# Patient Record
Sex: Female | Born: 1980 | Race: Black or African American | Hispanic: No | Marital: Married | Smoking: Never smoker
Health system: Southern US, Community
[De-identification: ages and names within clinical notes are randomized; demographics above are authoritative.]

## PROBLEM LIST (undated history)

## (undated) ENCOUNTER — Inpatient Hospital Stay (HOSPITAL_COMMUNITY): Payer: Self-pay

## (undated) DIAGNOSIS — N760 Acute vaginitis: Secondary | ICD-10-CM

## (undated) DIAGNOSIS — R638 Other symptoms and signs concerning food and fluid intake: Secondary | ICD-10-CM

## (undated) DIAGNOSIS — IMO0002 Reserved for concepts with insufficient information to code with codable children: Secondary | ICD-10-CM

## (undated) DIAGNOSIS — Z3490 Encounter for supervision of normal pregnancy, unspecified, unspecified trimester: Secondary | ICD-10-CM

## (undated) DIAGNOSIS — K8020 Calculus of gallbladder without cholecystitis without obstruction: Secondary | ICD-10-CM

## (undated) DIAGNOSIS — D573 Sickle-cell trait: Secondary | ICD-10-CM

## (undated) DIAGNOSIS — Z832 Family history of diseases of the blood and blood-forming organs and certain disorders involving the immune mechanism: Secondary | ICD-10-CM

## (undated) DIAGNOSIS — Z8619 Personal history of other infectious and parasitic diseases: Secondary | ICD-10-CM

## (undated) DIAGNOSIS — Z91040 Latex allergy status: Secondary | ICD-10-CM

## (undated) DIAGNOSIS — B9689 Other specified bacterial agents as the cause of diseases classified elsewhere: Secondary | ICD-10-CM

## (undated) HISTORY — DX: Calculus of gallbladder without cholecystitis without obstruction: K80.20

## (undated) HISTORY — DX: Reserved for concepts with insufficient information to code with codable children: IMO0002

## (undated) HISTORY — DX: Personal history of other infectious and parasitic diseases: Z86.19

## (undated) HISTORY — DX: Other specified bacterial agents as the cause of diseases classified elsewhere: B96.89

## (undated) HISTORY — DX: Family history of diseases of the blood and blood-forming organs and certain disorders involving the immune mechanism: Z83.2

## (undated) HISTORY — DX: Acute vaginitis: N76.0

## (undated) HISTORY — DX: Latex allergy status: Z91.040

## (undated) HISTORY — DX: Sickle-cell trait: D57.3

## (undated) HISTORY — PX: NO PAST SURGERIES: SHX2092

## (undated) HISTORY — DX: Encounter for supervision of normal pregnancy, unspecified, unspecified trimester: Z34.90

## (undated) HISTORY — DX: Other symptoms and signs concerning food and fluid intake: R63.8

---
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
## 2011-03-15 NOTE — L&D Delivery Note (Addendum)
Delivery Note At 10:37 AM a viable female, "Yesenia George", was delivered via Vaginal, Spontaneous Delivery (Presentation: Right Occiput Anterior).  APGAR: 9, 9; weight .   Placenta status: Intact, Spontaneous.  Cord: 3 vessels with the following complications: None.  Cord pH: NA Compound presentation of vtx and left hand.  Anesthesia: Epidural  Episiotomy: None Lacerations: 2nd degree Suture Repair: 3.0 monocryl Est. Blood Loss (mL): 300 cc  Mom to postpartum.  Baby to skin to skin.. Family does plan circumcision--will decide between inpatient or outpatient. 24 hour urine will be continued via foley catheter and will be completed at 7pm and sent for 24 hour protein assessment.  Yesenia George 11/06/2011, 11:28 AM

## 2011-05-25 ENCOUNTER — Encounter (INDEPENDENT_AMBULATORY_CARE_PROVIDER_SITE_OTHER): Payer: Medicaid Other

## 2011-05-25 DIAGNOSIS — Z331 Pregnant state, incidental: Secondary | ICD-10-CM

## 2011-05-25 LAB — CBC: Platelets: 177 10*3/uL (ref 150–399)

## 2011-05-25 LAB — RUBELLA ANTIBODY, IGM: Rubella: IMMUNE

## 2011-05-25 LAB — ANTIBODY SCREEN: Antibody Screen: NEGATIVE

## 2011-05-25 LAB — HIV ANTIBODY (ROUTINE TESTING W REFLEX): HIV: NONREACTIVE

## 2011-05-25 LAB — GC/CHLAMYDIA PROBE AMP, GENITAL
Chlamydia: NEGATIVE
Gonorrhea: NEGATIVE

## 2011-05-25 LAB — ABO/RH

## 2011-06-02 ENCOUNTER — Encounter (INDEPENDENT_AMBULATORY_CARE_PROVIDER_SITE_OTHER): Payer: Medicaid Other | Admitting: Obstetrics and Gynecology

## 2011-06-02 DIAGNOSIS — Z3480 Encounter for supervision of other normal pregnancy, unspecified trimester: Secondary | ICD-10-CM

## 2011-06-02 DIAGNOSIS — N898 Other specified noninflammatory disorders of vagina: Secondary | ICD-10-CM

## 2011-06-06 ENCOUNTER — Encounter: Payer: Self-pay | Admitting: Obstetrics and Gynecology

## 2011-06-09 ENCOUNTER — Encounter: Payer: Self-pay | Admitting: Obstetrics and Gynecology

## 2011-06-09 ENCOUNTER — Other Ambulatory Visit: Payer: Self-pay

## 2011-06-09 DIAGNOSIS — Z3480 Encounter for supervision of other normal pregnancy, unspecified trimester: Secondary | ICD-10-CM

## 2011-06-13 ENCOUNTER — Other Ambulatory Visit: Payer: Self-pay | Admitting: Obstetrics and Gynecology

## 2011-06-13 DIAGNOSIS — Z1389 Encounter for screening for other disorder: Secondary | ICD-10-CM

## 2011-06-17 DIAGNOSIS — Z91040 Latex allergy status: Secondary | ICD-10-CM | POA: Insufficient documentation

## 2011-06-17 DIAGNOSIS — D573 Sickle-cell trait: Secondary | ICD-10-CM | POA: Insufficient documentation

## 2011-06-17 DIAGNOSIS — R638 Other symptoms and signs concerning food and fluid intake: Secondary | ICD-10-CM | POA: Insufficient documentation

## 2011-06-17 DIAGNOSIS — O0930 Supervision of pregnancy with insufficient antenatal care, unspecified trimester: Secondary | ICD-10-CM | POA: Insufficient documentation

## 2011-06-21 ENCOUNTER — Encounter: Payer: Self-pay | Admitting: Registered Nurse

## 2011-06-21 ENCOUNTER — Ambulatory Visit (INDEPENDENT_AMBULATORY_CARE_PROVIDER_SITE_OTHER): Payer: Medicaid Other

## 2011-06-21 ENCOUNTER — Ambulatory Visit (INDEPENDENT_AMBULATORY_CARE_PROVIDER_SITE_OTHER): Payer: Medicaid Other | Admitting: Registered Nurse

## 2011-06-21 ENCOUNTER — Other Ambulatory Visit: Payer: Medicaid Other

## 2011-06-21 VITALS — BP 110/64 | Wt 272.0 lb

## 2011-06-21 DIAGNOSIS — Z1389 Encounter for screening for other disorder: Secondary | ICD-10-CM

## 2011-06-21 DIAGNOSIS — Z3480 Encounter for supervision of other normal pregnancy, unspecified trimester: Secondary | ICD-10-CM

## 2011-06-21 LAB — US OB COMP + 14 WK

## 2011-06-21 NOTE — Patient Instructions (Signed)
ROB appt.. 4 weeks

## 2011-06-21 NOTE — Progress Notes (Signed)
Denies complaints doing well. U/s rev'd. Advised to call office with questions and/or concerns. Did not receive 1 hour glucola today and as a result prefers to do 4/11 or 4/12. ROB x 4 weeks.

## 2011-06-23 ENCOUNTER — Other Ambulatory Visit (INDEPENDENT_AMBULATORY_CARE_PROVIDER_SITE_OTHER): Payer: Medicaid Other

## 2011-06-23 DIAGNOSIS — Z3480 Encounter for supervision of other normal pregnancy, unspecified trimester: Secondary | ICD-10-CM

## 2011-06-23 DIAGNOSIS — Z331 Pregnant state, incidental: Secondary | ICD-10-CM

## 2011-06-23 LAB — POCT UA - GLUCOSE/PROTEIN: Glucose, UA: NEGATIVE

## 2011-07-19 ENCOUNTER — Encounter: Payer: Self-pay | Admitting: Obstetrics and Gynecology

## 2011-07-19 ENCOUNTER — Ambulatory Visit (INDEPENDENT_AMBULATORY_CARE_PROVIDER_SITE_OTHER): Payer: Medicaid Other | Admitting: Obstetrics and Gynecology

## 2011-07-19 VITALS — BP 116/64 | Wt 277.0 lb

## 2011-07-19 DIAGNOSIS — Z331 Pregnant state, incidental: Secondary | ICD-10-CM

## 2011-07-19 DIAGNOSIS — R300 Dysuria: Secondary | ICD-10-CM

## 2011-07-19 LAB — POCT URINALYSIS DIPSTICK
Glucose, UA: NEGATIVE
Ketones, UA: NEGATIVE
Leukocytes, UA: NEGATIVE
Spec Grav, UA: 1.015
Urobilinogen, UA: NEGATIVE

## 2011-07-19 NOTE — Progress Notes (Signed)
Pt c/o pressure when urinating.  

## 2011-07-19 NOTE — Progress Notes (Signed)
Subjective:    Yesenia George is a 31 y.o. female being seen today for her return obstetrical visit. She is at [redacted]w[redacted]d gestation.  Patient reports backache, occasional contractions and and pelvic pressure.  Fetal movement: normal.   Review of Systems:    Objective:    BP 116/64  Wt 277 lb (125.646 kg)  LMP 02/04/2011  SEE PRENATAL FLOWSHEET  FHT:  140 BPM  Uterine Size: 24 cm  Presentation: unsure    Assessment:    Pregnancy:  G2P0010    Plan:    Patient Active Problem List  Diagnoses  . Late prenatal care  . Increased BMI  . Latex allergy  . Sickle cell trait       TB risk assessment: low OBGCT: discussed. OBGCT: ordered ptl precautions discussed   fetal movement reviewed  Follow up in 3-4 weeks Plans for next visit:  glucola  Alecia Doi A 07/19/2011 1:03 PM

## 2011-07-21 LAB — CULTURE, OB URINE
Colony Count: NO GROWTH
Organism ID, Bacteria: NO GROWTH

## 2011-08-09 ENCOUNTER — Ambulatory Visit (INDEPENDENT_AMBULATORY_CARE_PROVIDER_SITE_OTHER): Payer: Medicaid Other | Admitting: Obstetrics and Gynecology

## 2011-08-09 ENCOUNTER — Encounter: Payer: Self-pay | Admitting: Obstetrics and Gynecology

## 2011-08-09 ENCOUNTER — Other Ambulatory Visit: Payer: Medicaid Other

## 2011-08-09 VITALS — BP 118/62 | Ht 66.0 in | Wt 280.0 lb

## 2011-08-09 DIAGNOSIS — Z331 Pregnant state, incidental: Secondary | ICD-10-CM

## 2011-08-09 DIAGNOSIS — O26849 Uterine size-date discrepancy, unspecified trimester: Secondary | ICD-10-CM

## 2011-08-09 DIAGNOSIS — Z3490 Encounter for supervision of normal pregnancy, unspecified, unspecified trimester: Secondary | ICD-10-CM

## 2011-08-09 LAB — RPR

## 2011-08-09 NOTE — Progress Notes (Signed)
S>D.  [place]\ 1hr glu today

## 2011-08-24 ENCOUNTER — Encounter: Payer: Self-pay | Admitting: Obstetrics and Gynecology

## 2011-08-24 ENCOUNTER — Ambulatory Visit (INDEPENDENT_AMBULATORY_CARE_PROVIDER_SITE_OTHER): Payer: Medicaid Other

## 2011-08-24 ENCOUNTER — Ambulatory Visit (INDEPENDENT_AMBULATORY_CARE_PROVIDER_SITE_OTHER): Payer: Medicaid Other | Admitting: Obstetrics and Gynecology

## 2011-08-24 VITALS — BP 120/74 | Wt 284.0 lb

## 2011-08-24 DIAGNOSIS — O26849 Uterine size-date discrepancy, unspecified trimester: Secondary | ICD-10-CM

## 2011-08-24 DIAGNOSIS — Z331 Pregnant state, incidental: Secondary | ICD-10-CM

## 2011-08-24 DIAGNOSIS — Z3490 Encounter for supervision of normal pregnancy, unspecified, unspecified trimester: Secondary | ICD-10-CM

## 2011-08-24 LAB — US OB FOLLOW UP

## 2011-08-24 NOTE — Progress Notes (Addendum)
No complaints Ultrasound today estimated fetal weight 3 lbs. 1 oz. Present AFI of 14.2 cm cervix 3.6 cm vertex presentation anterior placenta Glucola 86, Hgb 11.1, RPR NR RTO 2 wks

## 2011-09-07 ENCOUNTER — Ambulatory Visit (INDEPENDENT_AMBULATORY_CARE_PROVIDER_SITE_OTHER): Payer: Medicaid Other | Admitting: Obstetrics and Gynecology

## 2011-09-07 VITALS — BP 110/70 | Wt 284.0 lb

## 2011-09-07 DIAGNOSIS — Z331 Pregnant state, incidental: Secondary | ICD-10-CM

## 2011-09-07 DIAGNOSIS — R12 Heartburn: Secondary | ICD-10-CM

## 2011-09-07 LAB — POCT URINALYSIS DIPSTICK
Ketones, UA: NEGATIVE
Leukocytes, UA: NEGATIVE
Protein, UA: NEGATIVE
pH, UA: 7

## 2011-09-07 MED ORDER — PANTOPRAZOLE SODIUM 20 MG PO TBEC: 20.0000 mg | DELAYED_RELEASE_TABLET | Freq: Every day | ORAL | Status: DC

## 2011-09-07 NOTE — Progress Notes (Signed)
History of sickle cell trait.  Father of the baby has not been tested.  He was referred for testing.  UA negative. Complains of increasing heartburn.  Try Protonix 20 mg each day. Return to office in 2 weeks. Dr. Stefano Gaul

## 2011-09-07 NOTE — Progress Notes (Signed)
C/O: Heartburn getting worse.

## 2011-09-21 ENCOUNTER — Ambulatory Visit (INDEPENDENT_AMBULATORY_CARE_PROVIDER_SITE_OTHER): Payer: Medicaid Other | Admitting: Obstetrics and Gynecology

## 2011-09-21 VITALS — BP 110/58 | Wt 287.0 lb

## 2011-09-21 DIAGNOSIS — D573 Sickle-cell trait: Secondary | ICD-10-CM

## 2011-09-21 DIAGNOSIS — Z331 Pregnant state, incidental: Secondary | ICD-10-CM

## 2011-09-21 DIAGNOSIS — IMO0002 Reserved for concepts with insufficient information to code with codable children: Secondary | ICD-10-CM

## 2011-09-21 NOTE — Progress Notes (Signed)
Good FA. Low muscle pain at end of day resolved with resting. S>D ultrasound at [place]

## 2011-09-21 NOTE — Progress Notes (Signed)
Pt. Stated she has concerns of swelling. No other issues today.

## 2011-09-27 ENCOUNTER — Inpatient Hospital Stay (HOSPITAL_COMMUNITY)
Admission: AD | Admit: 2011-09-27 | Discharge: 2011-09-27 | Disposition: A | Discharge status: Home / Self Care | Payer: Medicaid Other | Source: Ambulatory Visit | Attending: Obstetrics and Gynecology | Admitting: Obstetrics and Gynecology

## 2011-09-27 ENCOUNTER — Telehealth: Payer: Self-pay | Admitting: Obstetrics and Gynecology

## 2011-09-27 ENCOUNTER — Inpatient Hospital Stay (HOSPITAL_COMMUNITY): Payer: Medicaid Other

## 2011-09-27 ENCOUNTER — Encounter (HOSPITAL_COMMUNITY): Payer: Self-pay | Admitting: *Deleted

## 2011-09-27 DIAGNOSIS — O99891 Other specified diseases and conditions complicating pregnancy: Secondary | ICD-10-CM | POA: Insufficient documentation

## 2011-09-27 DIAGNOSIS — W19XXXA Unspecified fall, initial encounter: Secondary | ICD-10-CM

## 2011-09-27 DIAGNOSIS — W1809XA Striking against other object with subsequent fall, initial encounter: Secondary | ICD-10-CM | POA: Insufficient documentation

## 2011-09-27 LAB — URINALYSIS, ROUTINE W REFLEX MICROSCOPIC
Bilirubin Urine: NEGATIVE
Ketones, ur: NEGATIVE mg/dL
Specific Gravity, Urine: 1.01 (ref 1.005–1.030)
Urobilinogen, UA: 0.2 mg/dL (ref 0.0–1.0)

## 2011-09-27 LAB — URINE MICROSCOPIC-ADD ON

## 2011-09-27 MED ORDER — ACETAMINOPHEN 500 MG PO TABS: 1000.0000 mg | ORAL_TABLET | Freq: Once | ORAL | Status: DC | PRN

## 2011-09-27 NOTE — MAU Note (Signed)
Pt  States, " I fell onto a concrete walkway right onto my abdomen at 1:30 pm. I just now felt my baby move."

## 2011-09-27 NOTE — Telephone Encounter (Signed)
Spoke with pt rgdg telephone call. Pt states,"just now fell on abdomen after tripping on something outside when taking out the trash". Pt denies vaginal bleeding. C/o upper abd pain. Decreased fetal movement since fall. Pt is A+. Consulted with chs, pt to go to MAU for eval. Pt voices understanding.

## 2011-09-27 NOTE — MAU Note (Signed)
Cheyenne Adas, CNM here to see patient

## 2011-10-05 ENCOUNTER — Ambulatory Visit (INDEPENDENT_AMBULATORY_CARE_PROVIDER_SITE_OTHER): Payer: Medicaid Other

## 2011-10-05 ENCOUNTER — Other Ambulatory Visit: Payer: Self-pay | Admitting: Obstetrics and Gynecology

## 2011-10-05 ENCOUNTER — Ambulatory Visit (INDEPENDENT_AMBULATORY_CARE_PROVIDER_SITE_OTHER): Payer: Medicaid Other | Admitting: Obstetrics and Gynecology

## 2011-10-05 ENCOUNTER — Encounter: Payer: Self-pay | Admitting: Obstetrics and Gynecology

## 2011-10-05 VITALS — BP 106/62 | Wt 293.0 lb

## 2011-10-05 DIAGNOSIS — IMO0002 Reserved for concepts with insufficient information to code with codable children: Secondary | ICD-10-CM

## 2011-10-05 DIAGNOSIS — O3660X0 Maternal care for excessive fetal growth, unspecified trimester, not applicable or unspecified: Secondary | ICD-10-CM

## 2011-10-05 DIAGNOSIS — O26849 Uterine size-date discrepancy, unspecified trimester: Secondary | ICD-10-CM

## 2011-10-05 NOTE — Progress Notes (Signed)
Doing well. Ultrasound for size greater than dates: Single gestation, vertex, normal fluid, normal placenta. Patient was seen in maternity admission as last week after a fall.  Ultrasound at that point showed growth of the 77th percentile. Repeat ultrasound next visit. Beta strep Visit. Dr. Stefano Gaul

## 2011-10-05 NOTE — Progress Notes (Signed)
Pt c/o swelling in hands and feet.

## 2011-10-06 LAB — US OB LIMITED

## 2011-10-18 ENCOUNTER — Ambulatory Visit (INDEPENDENT_AMBULATORY_CARE_PROVIDER_SITE_OTHER): Payer: Medicaid Other

## 2011-10-18 ENCOUNTER — Ambulatory Visit (INDEPENDENT_AMBULATORY_CARE_PROVIDER_SITE_OTHER): Payer: Medicaid Other | Admitting: Obstetrics and Gynecology

## 2011-10-18 ENCOUNTER — Other Ambulatory Visit: Payer: Self-pay | Admitting: Obstetrics and Gynecology

## 2011-10-18 VITALS — BP 102/58 | Wt 301.0 lb

## 2011-10-18 DIAGNOSIS — O26849 Uterine size-date discrepancy, unspecified trimester: Secondary | ICD-10-CM

## 2011-10-18 DIAGNOSIS — Z331 Pregnant state, incidental: Secondary | ICD-10-CM

## 2011-10-18 NOTE — Progress Notes (Signed)
[redacted]w[redacted]d Ultrasound: Vertex, normal fluid, 37 weeks and 2 days (63rd percentile) Beta strep sent Returned office in 1 week Dr. Stefano Gaul

## 2011-10-18 NOTE — Progress Notes (Signed)
Pt stated c/o swelling in feet no other issues today .

## 2011-10-20 LAB — US OB FOLLOW UP

## 2011-10-26 ENCOUNTER — Encounter: Payer: Self-pay | Admitting: Obstetrics and Gynecology

## 2011-10-26 ENCOUNTER — Ambulatory Visit (INDEPENDENT_AMBULATORY_CARE_PROVIDER_SITE_OTHER): Payer: Medicaid Other | Admitting: Obstetrics and Gynecology

## 2011-10-26 VITALS — BP 114/62 | Wt 303.0 lb

## 2011-10-26 DIAGNOSIS — Z3400 Encounter for supervision of normal first pregnancy, unspecified trimester: Secondary | ICD-10-CM

## 2011-10-26 DIAGNOSIS — O0930 Supervision of pregnancy with insufficient antenatal care, unspecified trimester: Secondary | ICD-10-CM

## 2011-10-26 NOTE — Progress Notes (Signed)
Request Cervix Check. GBS positive.  In-hospital procedure explained Checklist review Information concerning IUD given

## 2011-10-26 NOTE — Patient Instructions (Signed)
mirena iud  Levonorgestrel intrauterine device (IUD) What is this medicine? LEVONORGESTREL IUD (LEE voe nor jes trel) is a contraceptive (birth control) device. It is used to prevent pregnancy and to treat heavy bleeding that occurs during your period. It can be used for up to 5 years. This medicine may be used for other purposes; ask your health care provider or pharmacist if you have questions. What should I tell my health care provider before I take this medicine? They need to know if you have any of these conditions: -abnormal Pap smear -cancer of the breast, uterus, or cervix -diabetes -endometritis -genital or pelvic infection now or in the past -have more than one sexual partner or your partner has more than one partner -heart disease -history of an ectopic or tubal pregnancy -immune system problems -IUD in place -liver disease or tumor -problems with blood clots or take blood-thinners -use intravenous drugs -uterus of unusual shape -vaginal bleeding that has not been explained -an unusual or allergic reaction to levonorgestrel, other hormones, silicone, or polyethylene, medicines, foods, dyes, or preservatives -pregnant or trying to get pregnant -breast-feeding How should I use this medicine? This device is placed inside the uterus by a health care professional. Talk to your pediatrician regarding the use of this medicine in children. Special care may be needed. Overdosage: If you think you have taken too much of this medicine contact a poison control center or emergency room at once. NOTE: This medicine is only for you. Do not share this medicine with others. What if I miss a dose? This does not apply. What may interact with this medicine? Do not take this medicine with any of the following medications: -amprenavir -bosentan -fosamprenavir This medicine may also interact with the following medications: -aprepitant -barbiturate medicines for inducing sleep or treating  seizures -bexarotene -griseofulvin -medicines to treat seizures like carbamazepine, ethotoin, felbamate, oxcarbazepine, phenytoin, topiramate -modafinil -pioglitazone -rifabutin -rifampin -rifapentine -some medicines to treat HIV infection like atazanavir, indinavir, lopinavir, nelfinavir, tipranavir, ritonavir -St. John's wort -warfarin This list may not describe all possible interactions. Give your health care provider a list of all the medicines, herbs, non-prescription drugs, or dietary supplements you use. Also tell them if you smoke, drink alcohol, or use illegal drugs. Some items may interact with your medicine. What should I watch for while using this medicine? Visit your doctor or health care professional for regular check ups. See your doctor if you or your partner has sexual contact with others, becomes HIV positive, or gets a sexual transmitted disease. This product does not protect you against HIV infection (AIDS) or other sexually transmitted diseases. You can check the placement of the IUD yourself by reaching up to the top of your vagina with clean fingers to feel the threads. Do not pull on the threads. It is a good habit to check placement after each menstrual period. Call your doctor right away if you feel more of the IUD than just the threads or if you cannot feel the threads at all. The IUD may come out by itself. You may become pregnant if the device comes out. If you notice that the IUD has come out use a backup birth control method like condoms and call your health care provider. Using tampons will not change the position of the IUD and are okay to use during your period. What side effects may I notice from receiving this medicine? Side effects that you should report to your doctor or health care professional as soon as  possible: -allergic reactions like skin rash, itching or hives, swelling of the face, lips, or tongue -fever, flu-like symptoms -genital sores -high  blood pressure -no menstrual period for 6 weeks during use -pain, swelling, warmth in the leg -pelvic pain or tenderness -severe or sudden headache -signs of pregnancy -stomach cramping -sudden shortness of breath -trouble with balance, talking, or walking -unusual vaginal bleeding, discharge -yellowing of the eyes or skin Side effects that usually do not require medical attention (report to your doctor or health care professional if they continue or are bothersome): -acne -breast pain -change in sex drive or performance -changes in weight -cramping, dizziness, or faintness while the device is being inserted -headache -irregular menstrual bleeding within first 3 to 6 months of use -nausea This list may not describe all possible side effects. Call your doctor for medical advice about side effects. You may report side effects to FDA at 1-800-FDA-1088. Where should I keep my medicine? This does not apply. NOTE: This sheet is a summary. It may not cover all possible information. If you have questions about this medicine, talk to your doctor, pharmacist, or health care provider.  2012, Elsevier/Gold Standard. (03/21/2008 6:39:08 PM  )Intrauterine Device Information An intrauterine device (IUD) is inserted into your uterus and prevents pregnancy. There are 2 types of IUDs available:  Copper IUD. This type of IUD is wrapped in copper wire and is placed inside the uterus. Copper makes the uterus and fallopian tubes produce a fluid that kills sperm. The copper IUD can stay in place for 10 years.   Hormone IUD. This type of IUD contains the hormone progestin (synthetic progesterone). The hormone thickens the cervical mucus and prevents sperm from entering the uterus, and it also thins the uterine lining to prevent implantation of a fertilized egg. The hormone can weaken or kill the sperm that get into the uterus. The hormone IUD can stay in place for 5 years.  Your caregiver will make sure you  are a good candidate for a contraceptive IUD. Discuss with your caregiver the possible side effects. ADVANTAGES  It is highly effective, reversible, long-acting, and low maintenance.   There are no estrogen-related side effects.   An IUD can be used when breastfeeding.   It is not associated with weight gain.   It works immediately after insertion.   The copper IUD does not interfere with your female hormones.   The progesterone IUD can make heavy menstrual periods lighter.   The progesterone IUD can be used for 5 years.   The copper IUD can be used for 10 years.  DISADVANTAGES  The progesterone IUD can be associated with irregular bleeding patterns.   The copper IUD can make your menstrual flow heavier and more painful.   You may experience cramping and vaginal bleeding after insertion.  Document Released: 02/02/2004 Document Revised: 02/17/2011 Document Reviewed: 07/03/2010 [facility] Patient Information 2012 [place], [place].

## 2011-11-02 ENCOUNTER — Telehealth: Payer: Self-pay

## 2011-11-02 ENCOUNTER — Ambulatory Visit (INDEPENDENT_AMBULATORY_CARE_PROVIDER_SITE_OTHER): Payer: Medicaid Other

## 2011-11-02 VITALS — BP 110/68 | Wt 306.0 lb

## 2011-11-02 DIAGNOSIS — O09899 Supervision of other high risk pregnancies, unspecified trimester: Secondary | ICD-10-CM

## 2011-11-02 DIAGNOSIS — O99820 Streptococcus B carrier state complicating pregnancy: Secondary | ICD-10-CM

## 2011-11-02 DIAGNOSIS — Z22330 Carrier of Group B streptococcus: Secondary | ICD-10-CM

## 2011-11-02 NOTE — Telephone Encounter (Signed)
Lm for pt to cb regarding rpt urine sample tomorrow per HS

## 2011-11-02 NOTE — Progress Notes (Signed)
[redacted]w[redacted]d; READY! BLE and pedal edema persists; cx unchanged.  GFM.  Rev'd labor s/s & FKC. BP WNL, but 2+ protein and only noted after pt left.  Will have pt RTO tomorrow to recheck.

## 2011-11-02 NOTE — Telephone Encounter (Signed)
Message copied by Janeece Agee on Wed Nov 02, 2011  4:14 PM ------      Message from: Carolanne Grumbling H      Created: Wed Nov 02, 2011  4:11 PM      Regarding: protein in urine       Saw pt today and didn't see until after pt had left that 2+ protein in urine.  Please call pt and schedule a ROB for tomorrow 11/03/11 to recheck; she may need to do a 24hr urine.  Her BP is normal.        Thanks,      [employer]

## 2011-11-02 NOTE — Progress Notes (Signed)
Pt c/o feet swelling very painful

## 2011-11-03 ENCOUNTER — Other Ambulatory Visit: Payer: Medicaid Other

## 2011-11-03 NOTE — Telephone Encounter (Signed)
Spoke with pt regarding HS note. Pt coming today to give urine sample checking for protein.

## 2011-11-05 ENCOUNTER — Encounter (HOSPITAL_COMMUNITY): Payer: Self-pay | Admitting: *Deleted

## 2011-11-05 ENCOUNTER — Telehealth: Payer: Self-pay | Admitting: Obstetrics and Gynecology

## 2011-11-05 ENCOUNTER — Inpatient Hospital Stay (HOSPITAL_COMMUNITY)
Admission: AD | Admit: 2011-11-05 | Discharge: 2011-11-09 | DRG: 775 | Disposition: A | Discharge status: Home / Self Care | Payer: Medicaid Other | Source: Ambulatory Visit | Attending: Obstetrics and Gynecology | Admitting: Obstetrics and Gynecology

## 2011-11-05 DIAGNOSIS — O139 Gestational [pregnancy-induced] hypertension without significant proteinuria, unspecified trimester: Secondary | ICD-10-CM

## 2011-11-05 DIAGNOSIS — O9902 Anemia complicating childbirth: Secondary | ICD-10-CM | POA: Diagnosis present

## 2011-11-05 DIAGNOSIS — Z22330 Carrier of Group B streptococcus: Secondary | ICD-10-CM

## 2011-11-05 DIAGNOSIS — O904 Postpartum acute kidney failure: Secondary | ICD-10-CM | POA: Diagnosis not present

## 2011-11-05 DIAGNOSIS — O9049 Other postpartum acute kidney failure: Secondary | ICD-10-CM | POA: Diagnosis not present

## 2011-11-05 DIAGNOSIS — R809 Proteinuria, unspecified: Secondary | ICD-10-CM | POA: Diagnosis present

## 2011-11-05 DIAGNOSIS — O1490 Unspecified pre-eclampsia, unspecified trimester: Secondary | ICD-10-CM | POA: Diagnosis present

## 2011-11-05 DIAGNOSIS — O99892 Other specified diseases and conditions complicating childbirth: Secondary | ICD-10-CM | POA: Diagnosis present

## 2011-11-05 DIAGNOSIS — R12 Heartburn: Secondary | ICD-10-CM

## 2011-11-05 DIAGNOSIS — D649 Anemia, unspecified: Secondary | ICD-10-CM | POA: Diagnosis not present

## 2011-11-05 DIAGNOSIS — D573 Sickle-cell trait: Secondary | ICD-10-CM | POA: Diagnosis present

## 2011-11-05 LAB — LACTATE DEHYDROGENASE: LDH: 179 U/L (ref 94–250)

## 2011-11-05 LAB — URINALYSIS, ROUTINE W REFLEX MICROSCOPIC
Ketones, ur: NEGATIVE mg/dL
Leukocytes, UA: NEGATIVE
Nitrite: NEGATIVE
Protein, ur: 100 mg/dL — AB
pH: 6 (ref 5.0–8.0)

## 2011-11-05 LAB — URINE MICROSCOPIC-ADD ON

## 2011-11-05 LAB — COMPREHENSIVE METABOLIC PANEL
ALT: 17 U/L (ref 0–35)
AST: 17 U/L (ref 0–37)
Albumin: 2.9 g/dL — ABNORMAL LOW (ref 3.5–5.2)
Alkaline Phosphatase: 102 U/L (ref 39–117)
Potassium: 4.1 mEq/L (ref 3.5–5.1)
Sodium: 135 mEq/L (ref 135–145)
Total Protein: 6.2 g/dL (ref 6.0–8.3)

## 2011-11-05 LAB — TYPE AND SCREEN: Antibody Screen: NEGATIVE

## 2011-11-05 LAB — CBC
MCHC: 34.9 g/dL (ref 30.0–36.0)
Platelets: 185 10*3/uL (ref 150–400)
RDW: 14.3 % (ref 11.5–15.5)

## 2011-11-05 MED ORDER — LACTATED RINGERS IV SOLN
INTRAVENOUS | Status: DC
  Administered 2011-11-05 – 2011-11-06 (×3): via INTRAVENOUS

## 2011-11-05 MED ORDER — IBUPROFEN 600 MG PO TABS: 600.0000 mg | ORAL_TABLET | Freq: Four times a day (QID) | ORAL | Status: DC | PRN

## 2011-11-05 MED ORDER — ACETAMINOPHEN 325 MG PO TABS: 650.0000 mg | ORAL_TABLET | ORAL | Status: DC | PRN

## 2011-11-05 MED ORDER — FLEET ENEMA 7-19 GM/118ML RE ENEM: 1.0000 | ENEMA | RECTAL | Status: DC | PRN

## 2011-11-05 MED ORDER — CITRIC ACID-SODIUM CITRATE 334-500 MG/5ML PO SOLN: 30.0000 mL | ORAL | Status: DC | PRN

## 2011-11-05 MED ORDER — BUTORPHANOL TARTRATE 1 MG/ML IJ SOLN
1.0000 mg | INTRAMUSCULAR | Status: DC | PRN
  Administered 2011-11-05: 1 mg via INTRAVENOUS
  Filled 2011-11-05: qty 1

## 2011-11-05 MED ORDER — OXYTOCIN BOLUS FROM INFUSION
250.0000 mL | Freq: Once | INTRAVENOUS | Status: DC
  Filled 2011-11-05: qty 500

## 2011-11-05 MED ORDER — PENICILLIN G POTASSIUM 5000000 UNITS IJ SOLR
5.0000 10*6.[IU] | Freq: Once | INTRAMUSCULAR | Status: DC
  Filled 2011-11-05: qty 5

## 2011-11-05 MED ORDER — OXYTOCIN 40 UNITS IN LACTATED RINGERS INFUSION - SIMPLE MED
62.5000 mL/h | Freq: Once | INTRAVENOUS | Status: AC
  Administered 2011-11-06: 62.5 mL/h via INTRAVENOUS

## 2011-11-05 MED ORDER — LACTATED RINGERS IV SOLN
500.0000 mL | INTRAVENOUS | Status: DC | PRN
  Administered 2011-11-06: 300 mL via INTRAVENOUS

## 2011-11-05 MED ORDER — LIDOCAINE HCL (PF) 1 % IJ SOLN
30.0000 mL | INTRAMUSCULAR | Status: DC | PRN
  Filled 2011-11-05: qty 30

## 2011-11-05 MED ORDER — ONDANSETRON HCL 4 MG/2ML IJ SOLN: 4.0000 mg | Freq: Four times a day (QID) | INTRAMUSCULAR | Status: DC | PRN

## 2011-11-05 MED ORDER — OXYCODONE-ACETAMINOPHEN 5-325 MG PO TABS: 1.0000 | ORAL_TABLET | ORAL | Status: DC | PRN

## 2011-11-05 MED ORDER — PENICILLIN G POTASSIUM 5000000 UNITS IJ SOLR
2.5000 10*6.[IU] | INTRAVENOUS | Status: DC
  Filled 2011-11-05 (×2): qty 2.5

## 2011-11-05 NOTE — MAU Note (Signed)
Pt reports having ctx q 5 min on and off since this morning. Denies bleeding or leaking of fluid.

## 2011-11-05 NOTE — Telephone Encounter (Signed)
TC from patient--39 1/7 weeks, UCs q 5-5 1/2 min since 10am.  Feels they are 7/10 on scale.  Denies leaking or bleeding, reports +FM. Cervix was closed, long, vtx -3 on last exam 8/21.  Labor process reviewed. Advised patient can come for eval or give a little more time at home. Patient will decide and let me know.
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
## 2011-11-06 ENCOUNTER — Encounter (HOSPITAL_COMMUNITY): Payer: Self-pay | Admitting: Anesthesiology

## 2011-11-06 ENCOUNTER — Inpatient Hospital Stay (HOSPITAL_COMMUNITY): Payer: Medicaid Other | Admitting: Anesthesiology

## 2011-11-06 ENCOUNTER — Encounter (HOSPITAL_COMMUNITY): Payer: Self-pay | Admitting: *Deleted

## 2011-11-06 LAB — CBC
HCT: 33.6 % — ABNORMAL LOW (ref 36.0–46.0)
MCH: 28.6 pg (ref 26.0–34.0)
MCHC: 34.5 g/dL (ref 30.0–36.0)
MCV: 82.8 fL (ref 78.0–100.0)
Platelets: 192 10*3/uL (ref 150–400)
RDW: 14.3 % (ref 11.5–15.5)
WBC: 10.6 10*3/uL — ABNORMAL HIGH (ref 4.0–10.5)

## 2011-11-06 LAB — COMPREHENSIVE METABOLIC PANEL
Albumin: 3.1 g/dL — ABNORMAL LOW (ref 3.5–5.2)
BUN: 7 mg/dL (ref 6–23)
Creatinine, Ser: 0.73 mg/dL (ref 0.50–1.10)
GFR calc Af Amer: >90 mL/min (ref >90)
Glucose, Bld: 101 mg/dL — ABNORMAL HIGH (ref 70–99)
Total Protein: 7 g/dL (ref 6.0–8.3)

## 2011-11-06 LAB — PROTEIN, URINE, 24 HOUR
Collection Interval-UPROT: 24 hours
Urine Total Volume-UPROT: 2825 mL

## 2011-11-06 LAB — URIC ACID: Uric Acid, Serum: 7.1 mg/dL — ABNORMAL HIGH (ref 2.4–7.0)

## 2011-11-06 LAB — LACTATE DEHYDROGENASE: LDH: 245 U/L (ref 94–250)

## 2011-11-06 MED ORDER — PRENATAL MULTIVITAMIN CH
1.0000 | ORAL_TABLET | Freq: Every day | ORAL | Status: DC
  Administered 2011-11-07 – 2011-11-09 (×3): 1 via ORAL
  Filled 2011-11-06 (×3): qty 1

## 2011-11-06 MED ORDER — PHENYLEPHRINE 40 MCG/ML (10ML) SYRINGE FOR IV PUSH (FOR BLOOD PRESSURE SUPPORT): 80.0000 ug | PREFILLED_SYRINGE | INTRAVENOUS | Status: DC | PRN

## 2011-11-06 MED ORDER — PENICILLIN G POTASSIUM 5000000 UNITS IJ SOLR
5.0000 10*6.[IU] | Freq: Once | INTRAVENOUS | Status: AC
  Administered 2011-11-06: 5 10*6.[IU] via INTRAVENOUS
  Filled 2011-11-06: qty 5

## 2011-11-06 MED ORDER — LACTATED RINGERS IV SOLN: 500.0000 mL | Freq: Once | INTRAVENOUS | Status: DC

## 2011-11-06 MED ORDER — DEXTROSE 5 % IV SOLN
2.5000 10*6.[IU] | INTRAVENOUS | Status: DC
  Administered 2011-11-06: 2.5 10*6.[IU] via INTRAVENOUS
  Filled 2011-11-06 (×2): qty 2.5

## 2011-11-06 MED ORDER — DIPHENHYDRAMINE HCL 50 MG/ML IJ SOLN: 12.5000 mg | INTRAMUSCULAR | Status: DC | PRN

## 2011-11-06 MED ORDER — OXYTOCIN 40 UNITS IN LACTATED RINGERS INFUSION - SIMPLE MED
1.0000 m[IU]/min | INTRAVENOUS | Status: DC
Start: 2011-11-06 — End: 2011-11-06
  Administered 2011-11-06: 2 m[IU]/min via INTRAVENOUS
  Filled 2011-11-06: qty 1000

## 2011-11-06 MED ORDER — SENNOSIDES-DOCUSATE SODIUM 8.6-50 MG PO TABS
2.0000 | ORAL_TABLET | Freq: Every day | ORAL | Status: DC
  Administered 2011-11-06 – 2011-11-08 (×3): 2 via ORAL

## 2011-11-06 MED ORDER — LIDOCAINE HCL (PF) 1 % IJ SOLN
INTRAMUSCULAR | Status: DC | PRN
  Administered 2011-11-06 (×2): 4 mL

## 2011-11-06 MED ORDER — LANOLIN HYDROUS EX OINT: TOPICAL_OINTMENT | CUTANEOUS | Status: DC | PRN

## 2011-11-06 MED ORDER — IBUPROFEN 600 MG PO TABS
600.0000 mg | ORAL_TABLET | Freq: Four times a day (QID) | ORAL | Status: DC
  Administered 2011-11-06 – 2011-11-09 (×12): 600 mg via ORAL
  Filled 2011-11-06 (×12): qty 1

## 2011-11-06 MED ORDER — PANTOPRAZOLE SODIUM 20 MG PO TBEC
20.0000 mg | DELAYED_RELEASE_TABLET | Freq: Every day | ORAL | Status: DC
  Administered 2011-11-07: 20 mg via ORAL
  Filled 2011-11-06 (×5): qty 1

## 2011-11-06 MED ORDER — ONDANSETRON HCL 4 MG/2ML IJ SOLN: 4.0000 mg | INTRAMUSCULAR | Status: DC | PRN

## 2011-11-06 MED ORDER — ONDANSETRON HCL 4 MG PO TABS: 4.0000 mg | ORAL_TABLET | ORAL | Status: DC | PRN

## 2011-11-06 MED ORDER — TETANUS-DIPHTH-ACELL PERTUSSIS 5-2.5-18.5 LF-MCG/0.5 IM SUSP: 0.5000 mL | Freq: Once | INTRAMUSCULAR | Status: DC

## 2011-11-06 MED ORDER — FENTANYL 2.5 MCG/ML BUPIVACAINE 1/10 % EPIDURAL INFUSION (WH - ANES)
INTRAMUSCULAR | Status: DC | PRN
  Administered 2011-11-06: 14 mL/h via EPIDURAL

## 2011-11-06 MED ORDER — WITCH HAZEL-GLYCERIN EX PADS: 1.0000 "application " | MEDICATED_PAD | CUTANEOUS | Status: DC | PRN

## 2011-11-06 MED ORDER — PHENYLEPHRINE 40 MCG/ML (10ML) SYRINGE FOR IV PUSH (FOR BLOOD PRESSURE SUPPORT)
80.0000 ug | PREFILLED_SYRINGE | INTRAVENOUS | Status: DC | PRN
  Filled 2011-11-06: qty 5

## 2011-11-06 MED ORDER — FENTANYL CITRATE 0.05 MG/ML IJ SOLN
100.0000 ug | INTRAMUSCULAR | Status: DC | PRN
  Administered 2011-11-06 (×2): 100 ug via INTRAVENOUS
  Filled 2011-11-06 (×2): qty 2

## 2011-11-06 MED ORDER — TERBUTALINE SULFATE 1 MG/ML IJ SOLN: 0.2500 mg | Freq: Once | INTRAMUSCULAR | Status: DC | PRN

## 2011-11-06 MED ORDER — ZOLPIDEM TARTRATE 5 MG PO TABS: 5.0000 mg | ORAL_TABLET | Freq: Every evening | ORAL | Status: DC | PRN

## 2011-11-06 MED ORDER — OXYCODONE-ACETAMINOPHEN 5-325 MG PO TABS
1.0000 | ORAL_TABLET | ORAL | Status: DC | PRN
  Administered 2011-11-07 – 2011-11-08 (×4): 1 via ORAL
  Filled 2011-11-06 (×4): qty 1

## 2011-11-06 MED ORDER — EPHEDRINE 5 MG/ML INJ
10.0000 mg | INTRAVENOUS | Status: DC | PRN
  Filled 2011-11-06: qty 4

## 2011-11-06 MED ORDER — DIPHENHYDRAMINE HCL 25 MG PO CAPS: 25.0000 mg | ORAL_CAPSULE | Freq: Four times a day (QID) | ORAL | Status: DC | PRN

## 2011-11-06 MED ORDER — BENZOCAINE-MENTHOL 20-0.5 % EX AERO
1.0000 "application " | INHALATION_SPRAY | CUTANEOUS | Status: DC | PRN
  Filled 2011-11-06: qty 56

## 2011-11-06 MED ORDER — SIMETHICONE 80 MG PO CHEW: 80.0000 mg | CHEWABLE_TABLET | ORAL | Status: DC | PRN

## 2011-11-06 MED ORDER — EPHEDRINE 5 MG/ML INJ: 10.0000 mg | INTRAVENOUS | Status: DC | PRN

## 2011-11-06 MED ORDER — FENTANYL 2.5 MCG/ML BUPIVACAINE 1/10 % EPIDURAL INFUSION (WH - ANES)
14.0000 mL/h | INTRAMUSCULAR | Status: DC
  Administered 2011-11-06: 14 mL/h via EPIDURAL
  Filled 2011-11-06 (×2): qty 60

## 2011-11-06 MED ORDER — DIBUCAINE 1 % RE OINT: 1.0000 "application " | TOPICAL_OINTMENT | RECTAL | Status: DC | PRN

## 2011-11-06 MED ORDER — DIPHENHYDRAMINE HCL 50 MG/ML IJ SOLN
25.0000 mg | Freq: Four times a day (QID) | INTRAMUSCULAR | Status: DC | PRN
  Administered 2011-11-06: 25 mg via INTRAVENOUS
  Filled 2011-11-06: qty 1

## 2011-11-06 NOTE — Progress Notes (Signed)
Subjective: Pt resting in between ctxs, which have spaced since receiving Stadol around 2300, but still very uncomfortable, and labored breathing and grimace during all ctxs.  No PIH s/s.  C/o itching since stadol, so pt just received IV Benadryl at 0017.    Objective: BP 139/84  Pulse 70  Temp 99.5 F (37.5 C) (Oral)  Resp 20  LMP 02/04/2011 I/O last 3 completed shifts: In: -  Out: 200 [Urine:200]    FHT:  FHR: 125 bpm, variability: moderate,  accelerations:  Present,  decelerations:  Absent UC:   irregular, every 7-9 minutes SVE:   Dilation: 1 Effacement (%): 100;90 Station: -2 Exam by:: c soliz rn  Labs: Lab Results  Component Value Date   WBC 8.6 11/05/2011   HGB 11.1* 11/05/2011   HCT 31.8* 11/05/2011   MCV 83.0 11/05/2011   PLT 185 11/05/2011    Assessment / Plan: 1.  [redacted]w[redacted]d  2. GHTN  3. GBS pos  4.  low pain tolerance  Labor: prodromal  Preeclampsia:  labs stable Fetal Wellbeing:  Category I Pain Control:  stadol x1 I/D:  n/a Anticipated MOD:  NSVD 1. Per c/w dr. Stefano George, induction rec'd.  IOL disc'd w/ pt and s.o.; r/b/a rev'd including higher risk of c/s with IOL vs awaiting spontaneous labor, however rec'd IOL in light of elevated BP, and possible risk of prolonging pregnancy w/ GHTN.  Following discussion, pt verbalized understanding and agreeable to proceed with induction. 2.  Will start low-dose pitocin now.  IV Fentanyl prn.  Enc'd pt to defer epidural at present. 3.  24 hour urine in progress. 4.  PCN-G for GBS. 5.  C/w MD prn. Yesenia George 11/06/2011, 12:38 AM

## 2011-11-06 NOTE — Anesthesia Procedure Notes (Signed)
Epidural Patient location during procedure: OB Start time: 11/06/2011 6:34 AM  Staffing Anesthesiologist: Dashonna Chagnon A. Performed by: anesthesiologist   Preanesthetic Checklist Completed: patient identified, site marked, surgical consent, pre-op evaluation, timeout performed, IV checked, risks and benefits discussed and monitors and equipment checked  Epidural Patient position: sitting Prep: site prepped and draped and DuraPrep Patient monitoring: continuous pulse ox and blood pressure Approach: midline Injection technique: LOR air  Needle:  Needle type: Tuohy  Needle gauge: 17 G Needle length: 9 cm Needle insertion depth: 6 cm Catheter type: closed end flexible Catheter size: 19 Gauge Catheter at skin depth: 10 cm Test dose: negative and Other  Assessment Events: blood not aspirated, injection not painful, no injection resistance, negative IV test and no paresthesia  Additional Notes Patient identified. Risks and benefits discussed including failed block, incomplete  Pain control, post dural puncture headache, nerve damage, paralysis, blood pressure Changes, nausea, vomiting, reactions to medications-both toxic and allergic and post Partum back pain. All questions were answered. Patient expressed understanding and wished to proceed. Sterile technique was used throughout procedure. Epidural site was Dressed with sterile barrier dressing. No paresthesias, signs of intravascular injection Or signs of intrathecal spread were encountered.  Patient was more comfortable after the epidural was dosed. Please see RN's note for documentation of vital signs and FHR which are stable.

## 2011-11-06 NOTE — Progress Notes (Signed)
Subjective: Pt continues breathing w/ ctxs.  Pitocin started around 0100 and now on 4mu.  S.o. Remains at bedside, and two female visitors.    Objective: BP 153/90  Pulse 82  Temp 99.5 F (37.5 C) (Oral)  Resp 20  LMP 02/04/2011 I/O last 3 completed shifts: In: -  Out: 200 [Urine:200]   . Filed Vitals:   11/06/11 0000 11/06/11 0056 11/06/11 0212 11/06/11 0300  BP:  134/55 153/90   Pulse:  69 82   Temp:      TempSrc:      Resp: 20 20 20 20    FHT:  FHR: 125 bpm, variability: moderate,  accelerations:  Present,  decelerations:  Absent UC:   regular, every 3-4 minutes SVE:   Dilation: 1 Effacement (%): 100;90 Station: -2 Exam by:: c soliz rn  Labs: Lab Results  Component Value Date   WBC 8.6 11/05/2011   HGB 11.1* 11/05/2011   HCT 31.8* 11/05/2011   MCV 83.0 11/05/2011   PLT 185 11/05/2011    Assessment / Plan: 1. [redacted]w[redacted]d 2. GHTN  3. GBS pos  Labor: latent Preeclampsia:  labs stable Fetal Wellbeing:  Category I Pain Control:  Fentanyl I/D:  n/a Anticipated MOD:  NSVD 1.  Continue to titrate pitocin; enc'd pt to try IV pain meds for latent labor and defer epidural until more regular pattern.  Recheck cx prn. 2. C/w MD prn. Yesenia George H 11/06/2011, 4:00 AM

## 2011-11-06 NOTE — Progress Notes (Signed)
Subjective: Last does of Fentanyl w/o much relief.  Desired cervical exam.  Pitocin remains on 4mu.  No PIH s/s.  AM PIH labs have not been drawn, but lab called as pt desires epidural now.  Pt w/ one episode of emesis.    Objective: BP 143/81  Pulse 81  Temp 99.5 F (37.5 C) (Oral)  Resp 20  LMP 02/04/2011 I/O last 3 completed shifts: In: -  Out: 200 [Urine:200]   .Marland Kitchen Filed Vitals:   11/06/11 0056 11/06/11 0212 11/06/11 0300 11/06/11 0403  BP: 134/55 153/90  143/81  Pulse: 69 82  81  Temp:      TempSrc:      Resp: 20 20 20 20    FHT:  FHR: 130 bpm, variability: moderate,  accelerations:  Present,  decelerations:  Absent UC:   regular, every 2-3 minutes SVE:   Dilation: 3 Effacement (%): 100;90 Station: -2 Exam by:: Julie Nay cnm  Assessment / Plan: 1. [redacted]w[redacted]d  2.  early labor  3. GHTN  4. GBS pos  Labor: Progressing on Pitocin, will continue to increase then AROM Preeclampsia:  labs stable Fetal Wellbeing:  Category I Pain Control:  awaiting labs for epidural I/D:  n/a Anticipated MOD:  NSVD 1.  Continue current POC 2.  C/w MD prn  Christabella Alvira H 11/06/2011, 5:27 AM

## 2011-11-06 NOTE — Progress Notes (Signed)
Discussing with pt POC

## 2011-11-06 NOTE — Anesthesia Preprocedure Evaluation (Addendum)
Anesthesia Evaluation  Patient identified by MRN, date of birth, ID band Patient awake    Reviewed: Allergy & Precautions, H&P , Patient's Chart, lab work & pertinent test results  Airway Mallampati: III TM Distance: >3 FB Neck ROM: full    Dental No notable dental hx. (+) Teeth Intact   Pulmonary neg pulmonary ROS,  breath sounds clear to auscultation  Pulmonary exam normal       Cardiovascular hypertension, Rhythm:regular Rate:Normal  PIH   Neuro/Psych negative neurological ROS  negative psych ROS   GI/Hepatic Neg liver ROS, GERD-  Medicated and Controlled,  Endo/Other  Morbid obesity  Renal/GU negative Renal ROS  negative genitourinary   Musculoskeletal negative musculoskeletal ROS (+)   Abdominal Normal abdominal exam  (+)   Peds  Hematology negative hematology ROS (+) Blood dyscrasia, Sickle cell trait ,   Anesthesia Other Findings   Reproductive/Obstetrics (+) Pregnancy                          Anesthesia Physical Anesthesia Plan  ASA: III  Anesthesia Plan: Epidural   Post-op Pain Management:    Induction:   Airway Management Planned: Natural Airway  Additional Equipment:   Intra-op Plan:   Post-operative Plan:   Informed Consent: I have reviewed the patients History and Physical, chart, labs and discussed the procedure including the risks, benefits and alternatives for the proposed anesthesia with the patient or authorized representative who has indicated his/her understanding and acceptance.     Plan Discussed with: Anesthesiologist  Anesthesia Plan Comments:        Anesthesia Quick Evaluation

## 2011-11-06 NOTE — Anesthesia Postprocedure Evaluation (Signed)
  Anesthesia Post-op Note  Patient: Yesenia George  Procedure(s) Performed: * No procedures listed *  Patient Location: PACU and Mother/Baby  Anesthesia Type: Epidural  Level of Consciousness: awake, alert  and oriented  Airway and Oxygen Therapy: Patient Spontanous Breathing    Post-op Assessment: Post-op Vital signs reviewed and Patient's Cardiovascular Status Stable  Post-op Vital Signs: stable  Complications: No apparent anesthesia complications

## 2011-11-07 LAB — COMPREHENSIVE METABOLIC PANEL
AST: 24 U/L (ref 0–37)
Albumin: 2.5 g/dL — ABNORMAL LOW (ref 3.5–5.2)
Calcium: 8.8 mg/dL (ref 8.4–10.5)
Creatinine, Ser: 1.04 mg/dL (ref 0.50–1.10)
GFR calc non Af Amer: 72 mL/min — ABNORMAL LOW (ref >90)

## 2011-11-07 LAB — CBC
MCH: 28.5 pg (ref 26.0–34.0)
MCV: 83.7 fL (ref 78.0–100.0)
Platelets: 153 10*3/uL (ref 150–400)
RDW: 14.6 % (ref 11.5–15.5)

## 2011-11-07 LAB — URIC ACID: Uric Acid, Serum: 7.6 mg/dL — ABNORMAL HIGH (ref 2.4–7.0)

## 2011-11-07 NOTE — Progress Notes (Signed)
Post Partum Day 1 Subjective: no complaints, up ad lib without syncope, voiding, tolerating PO, + flatus  Pain well controlled with po meds BF baby feeding well, baby tends to like skin to skin on parents chest. Cries if not snuggled on the chest. Mood stable, bonding well Contraception: Discussed Mirena IUD, would like placement at approx. 6 weeks postparrtum.   Objective: Blood pressure 111/62, pulse 69, temperature 98.5 F (36.9 C), temperature source Oral, resp. rate 20, height 5\' 6"  (1.676 m), weight 306 lb (138.801 kg), last menstrual period 02/04/2011, SpO2 100.00%, unknown if currently breastfeeding.  Physical Exam:  General: alert, cooperative and no distress Lungs: CTAB Heart: RRR Breasts:non tender Lochia: appropriate Uterine Fundus: firm Perineum: DVT Evaluation: No evidence of DVT seen on physical exam. Negative Homan's sign. Calf/Ankle edema is present.   Basename 11/07/11 0530 11/06/11 0530  HGB 9.6* 11.6*  HCT 28.2* 33.6*    Assessment/Plan: Plan for discharge tomorrow      LOS: 2 days   Yesenia George 11/07/2011, 8:20 AM

## 2011-11-07 NOTE — Progress Notes (Signed)
UR chart review completed.  
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
## 2011-11-08 ENCOUNTER — Encounter: Payer: Medicaid Other | Admitting: Obstetrics and Gynecology

## 2011-11-08 DIAGNOSIS — R809 Proteinuria, unspecified: Secondary | ICD-10-CM

## 2011-11-08 DIAGNOSIS — D649 Anemia, unspecified: Secondary | ICD-10-CM | POA: Diagnosis not present

## 2011-11-08 DIAGNOSIS — N179 Acute kidney failure, unspecified: Secondary | ICD-10-CM

## 2011-11-08 DIAGNOSIS — O1490 Unspecified pre-eclampsia, unspecified trimester: Secondary | ICD-10-CM | POA: Diagnosis present

## 2011-11-08 DIAGNOSIS — IMO0002 Reserved for concepts with insufficient information to code with codable children: Secondary | ICD-10-CM

## 2011-11-08 LAB — COMPREHENSIVE METABOLIC PANEL
ALT: 29 U/L (ref 0–35)
AST: 34 U/L (ref 0–37)
Albumin: 2.9 g/dL — ABNORMAL LOW (ref 3.5–5.2)
Alkaline Phosphatase: 85 U/L (ref 39–117)
GFR calc Af Amer: 74 mL/min — ABNORMAL LOW (ref >90)
Glucose, Bld: 75 mg/dL (ref 70–99)
Potassium: 4.1 mEq/L (ref 3.5–5.1)
Sodium: 137 mEq/L (ref 135–145)
Total Protein: 6.5 g/dL (ref 6.0–8.3)

## 2011-11-08 LAB — CBC
HCT: 28.2 % — ABNORMAL LOW (ref 36.0–46.0)
RDW: 14.6 % (ref 11.5–15.5)
WBC: 8.4 10*3/uL (ref 4.0–10.5)

## 2011-11-08 LAB — LACTATE DEHYDROGENASE: LDH: 225 U/L (ref 94–250)

## 2011-11-08 MED ORDER — MAGNESIUM SULFATE BOLUS VIA INFUSION
4.0000 g | Freq: Once | INTRAVENOUS | Status: DC
  Filled 2011-11-08: qty 500

## 2011-11-08 MED ORDER — MAGNESIUM SULFATE 40 MG/ML IJ SOLN
4.0000 g | Freq: Once | INTRAMUSCULAR | Status: DC
  Filled 2011-11-08: qty 100

## 2011-11-08 MED ORDER — ONDANSETRON HCL 4 MG/2ML IJ SOLN: 4.0000 mg | Freq: Three times a day (TID) | INTRAMUSCULAR | Status: DC | PRN

## 2011-11-08 MED ORDER — MAGNESIUM SULFATE 40 G IN LACTATED RINGERS - SIMPLE
2.0000 g/h | INTRAVENOUS | Status: AC
  Administered 2011-11-08 – 2011-11-09 (×2): 2 g/h via INTRAVENOUS
  Filled 2011-11-08 (×2): qty 500

## 2011-11-08 MED ORDER — LACTATED RINGERS IV SOLN
INTRAVENOUS | Status: AC
  Administered 2011-11-08 – 2011-11-09 (×2): via INTRAVENOUS

## 2011-11-08 MED ORDER — MAGNESIUM SULFATE 40 G IN LACTATED RINGERS - SIMPLE
2.0000 g/h | Freq: Once | INTRAVENOUS | Status: DC
Start: 2011-11-08 — End: 2011-11-08
  Filled 2011-11-08: qty 500

## 2011-11-08 NOTE — Progress Notes (Signed)
Post Partum Day 2 Subjective: no complaints. Ready to go home.  Denies headache, visual changes or abdominal pain  Objective: Blood pressure 119/63, pulse 68, temperature 98.1 F (36.7 C), temperature source Oral, resp. rate 18, height 5\' 6"  (1.676 m), weight 306 lb (138.801 kg), last menstrual period 02/04/2011, SpO2 99.00%.  Physical Exam:  General: alert, cooperative, appears stated age, no distress and moderately obese Lochia: appropriate DVT Evaluation: No evidence of DVT seen on physical exam.   Basename 11/08/11 1310 11/07/11 0530  HGB 9.8* 9.6*  HCT 28.2* 28.2*  24 urine for protein=>1700mg /24 hr Creat=1.15  Assessment: Proteinuria without increased BP at this point, though some BPs were elevated prior to delivery.  Cannot rule out pre-eclampsia. Elevated and increasing creat consistent with diagnosis of acute renal injury.  Cannot R/O primary renal disease.  R/O SLE.  PLAN: Magnesium recommended and accepted. ANA Repeat CMP in am.  If creat continues to increase will recommend renal consult.    LOS: 3 days   HAYGOOD,VANESSA P 11/08/2011, 3:03 PM

## 2011-11-08 NOTE — Progress Notes (Signed)
Patient ID: Yesenia George, female   DOB: 1981-02-26, 31 y.o.   MRN: [id number] Post Partum Day 2 Subjective: no complaints, up ad lib without syncope, voiding, tolerating PO, + flatus, No BM Pain well controlled with po meds BF well, some nipple pain, lactation in to see pt to assist  Mood stable, bonding well Pt denies HA/N/V/RUQ pain, states edema has improved  Husband supportive at Yesenia George   Objective: Blood pressure 114/80, pulse 85, temperature 98.1 F (36.7 C), temperature source Oral, resp. rate 18, height 5\' 6"  (1.676 m), weight 306 lb (138.801 kg), last menstrual period 02/04/2011, SpO2 100.00%, unknown if currently breastfeeding.  Filed Vitals:   11/07/11 0547 11/07/11 1400 11/07/11 2110 11/08/11 0512  BP: 111/62 124/84 116/79 114/80  Pulse: 69 76 79 85  Temp: 98.5 F (36.9 C) 97.8 F (36.6 C) 97.5 F (36.4 C) 98.1 F (36.7 C)  TempSrc: Oral  Oral Oral  Resp: 20 18 18 18   Height:      Weight:      SpO2:         Physical Exam:  General: alert, no distress and visibly upset about dx of pre-eclampsia and having to stay in additional 24 hours Lungs: CTAB Heart: RRR Breasts: filling, nipples intact Lochia: appropriate Uterine Fundus: firm Perineum: healing  DVT Evaluation: No evidence of DVT seen on physical exam. Negative Homan's sign. Calf/Ankle edema is present. 2+ bilateral LEE, neg clonus, DTR's 2+    Basename 11/07/11 0530 11/06/11 0530  HGB 9.6* 11.6*  HCT 28.2* 33.6*   24hr protein completed on 8-25 at 1900 =`1723 PIH labs normal yesterday, except creatine =1.04 and uric acid increased to 7.6 (was normal upon admission)   Assessment/Plan: Breastfeeding Anemia - hemodynamically stable BP's stable Likely pre-eclampsia secondary to elevated 24hr protein C/W Dr Pennie Rushing and will tx to Yesenia George for 24hrs of mag sulfate Dr Pennie Rushing in to see pt and d/w her and sig other Pt agrees w POC Will draw PIH labs now 4g bolus of mag sulfate then 2g/hour         LOS: 3 days   Yesenia George M 11/08/2011, 11:14 AM

## 2011-11-09 DIAGNOSIS — O904 Postpartum acute kidney failure: Secondary | ICD-10-CM | POA: Diagnosis not present

## 2011-11-09 LAB — COMPREHENSIVE METABOLIC PANEL
ALT: 15 U/L (ref 0–35)
AST: 25 U/L (ref 0–37)
Alkaline Phosphatase: 87 U/L (ref 39–117)
CO2: 24 mEq/L (ref 19–32)
Chloride: 105 mEq/L (ref 96–112)
GFR calc Af Amer: >90 mL/min (ref >90)
GFR calc non Af Amer: >90 mL/min (ref >90)
Glucose, Bld: 92 mg/dL (ref 70–99)
Potassium: 3.3 mEq/L — ABNORMAL LOW (ref 3.5–5.1)
Sodium: 139 mEq/L (ref 135–145)
Total Bilirubin: 0.3 mg/dL (ref 0.3–1.2)

## 2011-11-09 MED ORDER — IBUPROFEN 600 MG PO TABS: 600.0000 mg | ORAL_TABLET | Freq: Four times a day (QID) | ORAL | Status: AC | PRN

## 2011-11-09 NOTE — Discharge Summary (Signed)
Obstetric Discharge Summary   Patient was admitted for evaluation of elevated B/p and early labor.B/p has been elevated 140-150/80/-90. Labor augmented and labored with epidural NSVD with 2nd degree laceration with suture Monocryl repair. Mother and baby tolerated delivery well. A 24 hrs urine collected and resulted on 11/06/11. 24 hr urine Protein: 1, 723. Commenced Magnesium Sulfate 13.00hrs 11/08/11 x 24hrs. B/p has returned to normal 124/80 s/p Magnesium Sulfate Tx. Mild pp anemia. Lactating mother. Patient for discharge to home  With baby "Muna'. Birth Control:  BP 124/68  Pulse 81  Temp 97.9 F (36.6 C) (Oral)  Resp 16  Ht 5\' 6"  (1.676 m)  Wt 265 lb 0.3 oz (120.212 kg)  BMI 42.78 kg/m2  SpO2 99%  LMP 02/04/2011  Breastfeeding? Unknown  Reason for Admission: for evaluation of B/p and possible labor. In MAU B/p 140- 150/ 80-90, Cx 1/90%/0 Prenatal Procedures: ultrasound Intrapartum Procedures: spontaneous vaginal delivery Postpartum Procedures: Magnesium Sulfate for tx of Pre E Complications-Operative and Postpartum: none and Postpartum Tx of PreE with  magnesium Sulfate x 24 hrs, Mild postpartum Anemia Hemoglobin  Date Value Range Status  11/08/2011 9.8* 12.0 - 15.0 g/dL Final     HCT  Date Value Range Status  11/08/2011 28.2* 36.0 - 46.0 % Final    Physical Exam:  General: alert, cooperative and no distress Lochia: appropriate Uterine Fundus: firm DVT Evaluation: No evidence of DVT seen on physical exam. Negative Homan's sign. Calf/Ankle edema is present.  Discharge Diagnoses: Term Pregnancy-delivered and complicated by Pre eclampsia and tx'ed with Magnesium Sulfate.  Discharge Information: Date: 11/09/2011 Activity: pelvic rest Diet: routine and patient had potassium 3.3 and recommend K+ with bananas and tomatoes. Iron Rich Diet recommended. Medications: PNV, Ibuprofen and Iron Condition: stable Instructions: refer to practice specific booklet Discharge to:  home Patient will have B/p checked on Friday 11/11/11  By [facility] Nurse.   Newborn Data: Live born female "Muna", NSVD with VL Birth Weight: 7 lb 8.8 oz (3425 g) APGAR: 9, 9  Home with mother.  Aftyn Nott, CNM. 11/09/2011, 3:39 PM

## 2011-11-09 NOTE — Progress Notes (Signed)
Post Partum Day 3 NSVD but subsequently had diagnosis of PreE and Tx'ed with Magnesium Sulfate x 24hrs. Subjective: No complaints, up ad lib without syncope, voiding, tolerating PO, + flatus  Tolerating Magnesium Sulfate well.  Physical Exam:  Alert and AAO x 3  Lungs: clear bilaterallly CV: RRR GL: normal Abdomen: soft B/S x 4, Fundus - 3/U GU: normal Extremities: DTR's +1 and moving all limbs independently Pain well controlled with po meds BF going well and pumping regularly Mood stable, bonding well.  Continues Magnesium Sulfate 2gm /hr until 13.00hrs today 11/09/11.  Contraception: undecided will discuss prior to d/c home.   Objective: Blood pressure 135/86, pulse 93, temperature 98.1 F (36.7 C), temperature source Oral, resp. rate 16, height 5\' 6"  (1.676 m), weight 265 lb 0.3 oz (120.212 kg), last menstrual period 02/04/2011, SpO2 100.00%, unknown if currently breastfeeding.     Basename 11/08/11 1310 11/07/11 0530  HGB 9.8* 9.6*  HCT 28.2* 28.2*    Assessment/Plan: To continue Magnesium Sulfate until 13.00hrs today. Dr Estanislado Pandy to be contacted prior to d/c home later today. Birth control to be discussed prior to d/c home.      LOS: 4 days   Latoria Dry, CNM. 11/09/2011, 11:12 AM

## 2011-11-09 NOTE — Progress Notes (Signed)
UR chart review completed.  

## 2011-12-13 ENCOUNTER — Ambulatory Visit (INDEPENDENT_AMBULATORY_CARE_PROVIDER_SITE_OTHER): Payer: Medicaid Other | Admitting: Obstetrics and Gynecology

## 2011-12-13 ENCOUNTER — Encounter: Payer: Self-pay | Admitting: Obstetrics and Gynecology

## 2011-12-13 VITALS — BP 108/70 | Resp 16 | Wt 272.0 lb

## 2011-12-13 DIAGNOSIS — Z3009 Encounter for other general counseling and advice on contraception: Secondary | ICD-10-CM

## 2011-12-13 NOTE — Progress Notes (Signed)
Yesenia George is a 31 y.o. female who presents for a postpartum visit.   Type of delivery:  SVB, 2nd degree laceration  Patient reports doing well--no issues.  Wonders if stitches have dissolved.  Hx remarkable for: Pre-eclampsia, with 24 hours of magnesium sulfate pp Patient Active Problem List  Diagnosis  . Late prenatal care  . Increased BMI  . Latex allergy  . Sickle cell trait  . GBS (group B Streptococcus carrier), +RV culture, currently pregnant  . Vaginal delivery  . Second degree perineal laceration  . Preeclampsia  . Anemia  . Proteinuria      PPDS = 2  Contraception plan:  Plans Mirena   I have fully reviewed the prenatal and intrapartum course   Patient has not been sexually active since delivery.   The following portions of the patient's history were reviewed and updated as appropriate: allergies, current medications, past family history, past medical history, past social history, past surgical history and problem list.  Review of Systems Pertinent items are noted in HPI.   Objective:    Breastfeeding? Unknown Breastfeeding now. General:  alert, cooperative and no distress     Lungs: clear to auscultation bilaterally  Heart:  regular rate and rhythm, S1, S2 normal, no murmur  Abdomen: soft, non-tender; bowel sounds normal; no masses,  no organomegaly.   Incision:  2nd degree laceration well-healed   Vulva:  normal  Vagina: normal vagina  Cervix:  normal  Uterus: normal size, contour, position, consistency, mobility, non-tender, well-involuted  Adnexa:  normal adnexa             Assessment:     Normal postpartum exam.  Pap smear not done at today's visit. Due 3/14 Wants Mirena  Plan:  Follow-up for Mirena ASAP. Cultures done today.  Nigel Bridgeman CNM, [place] 12/13/2011 8:49 AM

## 2011-12-13 NOTE — Progress Notes (Signed)
Yesenia George  is 5 week postpartum following a spontaneous vaginal delivery at 77 gestational weeks Date: 11/06/11 female baby named [name] delivered by VL.  Breastfeeding: yes Bottlefeeding:  no  Post-partum blues / depression:  no  EPDS score: 2 History of abnormal Pap:  no  Last Pap: Date  06/02/11 Gestational diabetes:  no  Contraception:  Desires IUD MIRENA  Normal urinary function:  yes Normal GI function:  yes Returning to work:  Yes 2 wks after delivery

## 2011-12-14 LAB — GC/CHLAMYDIA PROBE AMP, GENITAL: Chlamydia, DNA Probe: NEGATIVE

## 2011-12-22 ENCOUNTER — Ambulatory Visit (INDEPENDENT_AMBULATORY_CARE_PROVIDER_SITE_OTHER): Payer: Medicaid Other | Admitting: Obstetrics and Gynecology

## 2011-12-22 ENCOUNTER — Encounter: Payer: Self-pay | Admitting: Obstetrics and Gynecology

## 2011-12-22 VITALS — BP 104/60 | Wt 274.0 lb

## 2011-12-22 DIAGNOSIS — Z309 Encounter for contraceptive management, unspecified: Secondary | ICD-10-CM

## 2011-12-22 DIAGNOSIS — IMO0001 Reserved for inherently not codable concepts without codable children: Secondary | ICD-10-CM

## 2011-12-22 DIAGNOSIS — Z30430 Encounter for insertion of intrauterine contraceptive device: Secondary | ICD-10-CM

## 2011-12-22 DIAGNOSIS — Z975 Presence of (intrauterine) contraceptive device: Secondary | ICD-10-CM

## 2011-12-22 MED ORDER — LEVONORGESTREL 20 MCG/24HR IU IUD
INTRAUTERINE_SYSTEM | Freq: Once | INTRAUTERINE | Status: AC
  Administered 2011-12-22: 16:00:00 via INTRAUTERINE

## 2011-12-22 NOTE — Progress Notes (Signed)
Patient ID: Yesenia George, female   DOB: 02-22-1981, 31 y.o.   MRN: [id number] IUD INSERTION NOTE   Consent signed after risks and benefits were reviewed including but not limited to bleeding, infection and risk of uterine perforation.  LMP: No LMP recorded. Patient is not currently having periods (Reason: Lactating). UPT: Negative GC / Chlamydia: negative  MIRENA SERIAL NUMBER: Lot [id number] exp 05/16 NDC [id number]  Prepping with Betadine Tenaculum placed on anterior lip of cervix Uterus sounded at  6 cm Insertion of MIRENA IUD per protocol without any complications  POST-PROCEDURE:  Patient instructed to call with fever or excessive pain Patient instructed to check IUD strings after each menstrual cycle   Follow-up: 6 weeks   Earl Gala, CNM. 12/22/2011 3:30 PM

## 2012-01-11 ENCOUNTER — Observation Stay (HOSPITAL_COMMUNITY)
Admission: EM | Admit: 2012-01-11 | Discharge: 2012-01-11 | Disposition: A | Discharge status: Home / Self Care | Payer: Medicaid Other | Attending: Emergency Medicine | Admitting: Emergency Medicine

## 2012-01-11 ENCOUNTER — Observation Stay (HOSPITAL_COMMUNITY): Payer: Medicaid Other

## 2012-01-11 ENCOUNTER — Encounter (HOSPITAL_COMMUNITY): Payer: Self-pay | Admitting: *Deleted

## 2012-01-11 ENCOUNTER — Emergency Department (HOSPITAL_COMMUNITY): Payer: Medicaid Other

## 2012-01-11 DIAGNOSIS — R112 Nausea with vomiting, unspecified: Secondary | ICD-10-CM | POA: Insufficient documentation

## 2012-01-11 DIAGNOSIS — Z87898 Personal history of other specified conditions: Secondary | ICD-10-CM | POA: Insufficient documentation

## 2012-01-11 DIAGNOSIS — D573 Sickle-cell trait: Secondary | ICD-10-CM | POA: Insufficient documentation

## 2012-01-11 DIAGNOSIS — Z91040 Latex allergy status: Secondary | ICD-10-CM | POA: Insufficient documentation

## 2012-01-11 DIAGNOSIS — Z8619 Personal history of other infectious and parasitic diseases: Secondary | ICD-10-CM | POA: Insufficient documentation

## 2012-01-11 DIAGNOSIS — K8020 Calculus of gallbladder without cholecystitis without obstruction: Principal | ICD-10-CM | POA: Insufficient documentation

## 2012-01-11 DIAGNOSIS — IMO0002 Reserved for concepts with insufficient information to code with codable children: Secondary | ICD-10-CM | POA: Insufficient documentation

## 2012-01-11 DIAGNOSIS — Z87448 Personal history of other diseases of urinary system: Secondary | ICD-10-CM | POA: Insufficient documentation

## 2012-01-11 HISTORY — DX: Calculus of gallbladder without cholecystitis without obstruction: K80.20

## 2012-01-11 LAB — LIPASE, BLOOD: Lipase: 49 U/L (ref 11–59)

## 2012-01-11 LAB — CBC WITH DIFFERENTIAL/PLATELET
Basophils Absolute: 0 10*3/uL (ref 0.0–0.1)
Eosinophils Relative: 1 % (ref 0–5)
Lymphocytes Relative: 18 % (ref 12–46)
Lymphs Abs: 1.5 10*3/uL (ref 0.7–4.0)
MCV: 80.6 fL (ref 78.0–100.0)
Neutro Abs: 6.6 10*3/uL (ref 1.7–7.7)
Neutrophils Relative %: 76 % (ref 43–77)
Platelets: 189 10*3/uL (ref 150–400)
RBC: 4.32 MIL/uL (ref 3.87–5.11)
RDW: 13.6 % (ref 11.5–15.5)
WBC: 8.7 10*3/uL (ref 4.0–10.5)

## 2012-01-11 LAB — COMPREHENSIVE METABOLIC PANEL
AST: 46 U/L — ABNORMAL HIGH (ref 0–37)
Albumin: 3.9 g/dL (ref 3.5–5.2)
BUN: 15 mg/dL (ref 6–23)
Chloride: 105 mEq/L (ref 96–112)
Creatinine, Ser: 1.15 mg/dL — ABNORMAL HIGH (ref 0.50–1.10)
Potassium: 4.1 mEq/L (ref 3.5–5.1)
Total Protein: 7.6 g/dL (ref 6.0–8.3)

## 2012-01-11 LAB — URINE MICROSCOPIC-ADD ON

## 2012-01-11 LAB — URINALYSIS, ROUTINE W REFLEX MICROSCOPIC
Bilirubin Urine: NEGATIVE
Leukocytes, UA: NEGATIVE
Nitrite: NEGATIVE
Specific Gravity, Urine: 1.012 (ref 1.005–1.030)
Urobilinogen, UA: 1 mg/dL (ref 0.0–1.0)
pH: 7 (ref 5.0–8.0)

## 2012-01-11 MED ORDER — SODIUM CHLORIDE 0.9 % IV BOLUS (SEPSIS)
1000.0000 mL | Freq: Once | INTRAVENOUS | Status: AC
  Administered 2012-01-11: 1000 mL via INTRAVENOUS

## 2012-01-11 MED ORDER — ONDANSETRON HCL 4 MG/2ML IJ SOLN
4.0000 mg | Freq: Once | INTRAMUSCULAR | Status: AC
  Administered 2012-01-11: 4 mg via INTRAVENOUS
  Filled 2012-01-11: qty 2

## 2012-01-11 MED ORDER — MORPHINE SULFATE 4 MG/ML IJ SOLN
6.0000 mg | Freq: Once | INTRAMUSCULAR | Status: AC
  Administered 2012-01-11: 6 mg via INTRAVENOUS
  Filled 2012-01-11: qty 2

## 2012-01-11 NOTE — ED Notes (Signed)
Patient complained of chest pain with 1 episode of N/V.  No radiation of chest pain.  Given 324 ASA, 2 NTG with short term relief.  Patient had baby 9 weeks at [facility].

## 2012-01-11 NOTE — ED Notes (Signed)
Ice chips given to patient.

## 2012-01-11 NOTE — ED Provider Notes (Signed)
Patient placed in CDU by Wynetta Emery, PA-C; awaiting abdominal ultrasound and urinalysis results. Patient is here for complaints of chest pain and right upper quadrant pain and has received fluid and morphine.  Patient re-evaluated and is resting comfortable, VSS, with no new complaints or concerns at this time. Plan per previous provider is to await ultrasound scan and D/C if negative. On exam: hemodynamically stable, NAD, heart w/ RRR, lungs CTAB, mild abd tender to palpation in the right upper quadrant, no peripheral edema or calf tenderness. UA without evidence of UTI.  Awaiting results of gallbladder US.     Dahlia Client Virdie Penning, PA-C 01/11/12 [phone]

## 2012-01-11 NOTE — ED Provider Notes (Signed)
Medical screening examination/treatment/procedure(s) were performed by non-physician practitioner and as supervising physician I was immediately available for consultation/collaboration.  Tameko Halder R. Demyah Smyre, MD 01/11/12 1641 
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
## 2012-01-11 NOTE — ED Notes (Signed)
Pt has arrived in cdu from ultrasound. She is ambulatory to bed

## 2012-01-11 NOTE — ED Provider Notes (Signed)
31 year old female CDU to await results of abdominal ultrasound. Ultrasound showing gallstones without cholecystitis. Currently patient is not complaining of any pain, nausea or vomiting. She states she would like to go home. On exam patient is resting comfortably in bed in no apparent distress. She is AAO x3. Heart RRR. Lungs CTA. Abdomen is soft and nontender. No right upper quadrant pain. I advised followup with surgery discuss gallstones. Advised her to stay away from fatty and greasy foods. Patient states understanding of plan and is agreeable.  Dg Chest 2 View  01/11/2012  *RADIOLOGY REPORT*  Clinical Data: Shortness of breath, chest pain.  CHEST - 2 VIEW  Comparison: None.  Findings: Cardiomediastinal silhouette appears normal.  No acute pulmonary disease is noted.  Bony thorax is intact.  IMPRESSION: No acute cardiopulmonary abnormality seen.   Original Report Authenticated By: Venita Sheffield., M.D.    US Abdomen Complete  01/11/2012  *RADIOLOGY REPORT*  Clinical Data:  Right upper quadrant pain.  COMPLETE ABDOMINAL ULTRASOUND  Comparison:  None.  Findings:  Gallbladder:  Multiple gallstones within the gallbladder, the largest measuring 5 mm.  No wall thickening.  Negative sonographic Murphy's.  Common bile duct:   Normal caliber, 4 mm.  Liver:  No focal lesion identified.  Within normal limits in parenchymal echogenicity.  IVC:  Appears normal.  Pancreas:  No focal abnormality seen.  Spleen:  Within normal limits in size and echotexture.  Right Kidney:   Normal in size and parenchymal echogenicity.  No evidence of mass or hydronephrosis.  Left Kidney:  Normal in size and parenchymal echogenicity.  No evidence of mass or hydronephrosis.  Abdominal aorta:  No aneurysm identified.  IMPRESSION: Cholelithiasis.  No sonographic evidence of acute cholecystitis.   Original Report Authenticated By: Cyndie Chime, M.D.     Trevor Mace, PA-C 01/11/12 [phone]

## 2012-01-11 NOTE — ED Provider Notes (Signed)
Medical screening examination/treatment/procedure(s) were performed by non-physician practitioner and as supervising physician I was immediately available for consultation/collaboration.  Delta Pichon R. Rion Catala, MD 01/11/12 1641 

## 2012-01-11 NOTE — ED Notes (Signed)
Patient states she will be able to provide urine in "a little while".  Sample cup supplied.

## 2012-01-12 ENCOUNTER — Encounter (INDEPENDENT_AMBULATORY_CARE_PROVIDER_SITE_OTHER): Payer: Self-pay | Admitting: General Surgery

## 2012-01-12 ENCOUNTER — Ambulatory Visit (INDEPENDENT_AMBULATORY_CARE_PROVIDER_SITE_OTHER): Payer: Medicaid Other | Admitting: General Surgery

## 2012-01-12 VITALS — BP 128/82 | HR 70 | Temp 97.2°F | Resp 18 | Ht 66.0 in | Wt 269.8 lb

## 2012-01-12 DIAGNOSIS — K8020 Calculus of gallbladder without cholecystitis without obstruction: Secondary | ICD-10-CM

## 2012-01-12 NOTE — Patient Instructions (Signed)
Please refer to the educational materials you received today.  If you have any questions or would like to schedule surgery, please call the office
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
## 2012-01-12 NOTE — ED Provider Notes (Signed)
Medical screening examination/treatment/procedure(s) were performed by non-physician practitioner and as supervising physician I was immediately available for consultation/collaboration.   Lyanne Co, MD 01/12/12 [phone]

## 2012-01-13 ENCOUNTER — Inpatient Hospital Stay (HOSPITAL_COMMUNITY)
Admission: EM | Admit: 2012-01-13 | Discharge: 2012-01-17 | DRG: 417 | Disposition: A | Discharge status: Home / Self Care | Payer: Medicaid Other | Attending: Internal Medicine | Admitting: Internal Medicine

## 2012-01-13 ENCOUNTER — Telehealth: Payer: Self-pay | Admitting: Obstetrics and Gynecology

## 2012-01-13 ENCOUNTER — Ambulatory Visit (INDEPENDENT_AMBULATORY_CARE_PROVIDER_SITE_OTHER): Payer: Self-pay | Admitting: Obstetrics and Gynecology

## 2012-01-13 ENCOUNTER — Encounter: Payer: Self-pay | Admitting: Obstetrics and Gynecology

## 2012-01-13 VITALS — BP 122/60 | Ht 66.0 in | Wt 269.0 lb

## 2012-01-13 DIAGNOSIS — K8020 Calculus of gallbladder without cholecystitis without obstruction: Secondary | ICD-10-CM | POA: Diagnosis present

## 2012-01-13 DIAGNOSIS — Z6841 Body Mass Index (BMI) 40.0 and over, adult: Secondary | ICD-10-CM

## 2012-01-13 DIAGNOSIS — R1011 Right upper quadrant pain: Secondary | ICD-10-CM | POA: Diagnosis present

## 2012-01-13 DIAGNOSIS — R748 Abnormal levels of other serum enzymes: Secondary | ICD-10-CM | POA: Diagnosis present

## 2012-01-13 DIAGNOSIS — Z91040 Latex allergy status: Secondary | ICD-10-CM

## 2012-01-13 DIAGNOSIS — N182 Chronic kidney disease, stage 2 (mild): Secondary | ICD-10-CM | POA: Diagnosis present

## 2012-01-13 DIAGNOSIS — Z30432 Encounter for removal of intrauterine contraceptive device: Secondary | ICD-10-CM

## 2012-01-13 DIAGNOSIS — D573 Sickle-cell trait: Secondary | ICD-10-CM | POA: Diagnosis present

## 2012-01-13 DIAGNOSIS — R112 Nausea with vomiting, unspecified: Secondary | ICD-10-CM

## 2012-01-13 DIAGNOSIS — K851 Biliary acute pancreatitis without necrosis or infection: Secondary | ICD-10-CM

## 2012-01-13 DIAGNOSIS — K859 Acute pancreatitis without necrosis or infection, unspecified: Secondary | ICD-10-CM | POA: Diagnosis present

## 2012-01-13 DIAGNOSIS — K8019 Calculus of gallbladder with other cholecystitis with obstruction: Principal | ICD-10-CM | POA: Diagnosis present

## 2012-01-13 NOTE — Patient Instructions (Signed)
Intrauterine Device Information  An intrauterine device (IUD) is inserted into your uterus and prevents pregnancy. There are 2 types of IUDs available:  · Copper IUD. This type of IUD is wrapped in copper wire and is placed inside the uterus. Copper makes the uterus and fallopian tubes produce a fluid that kills sperm. The copper IUD can stay in place for 10 years.  · Hormone IUD. This type of IUD contains the hormone progestin (synthetic progesterone). The hormone thickens the cervical mucus and prevents sperm from entering the uterus, and it also thins the uterine lining to prevent implantation of a fertilized egg. The hormone can weaken or kill the sperm that get into the uterus. The hormone IUD can stay in place for 5 years.  Your caregiver will make sure you are a good candidate for a contraceptive IUD. Discuss with your caregiver the possible side effects.  ADVANTAGES  · It is highly effective, reversible, long-acting, and low maintenance.  · There are no estrogen-related side effects.  · An IUD can be used when breastfeeding.  · It is not associated with weight gain.  · It works immediately after insertion.  · The copper IUD does not interfere with your female hormones.  · The progesterone IUD can make heavy menstrual periods lighter.  · The progesterone IUD can be used for 5 years.  · The copper IUD can be used for 10 years.  DISADVANTAGES  · The progesterone IUD can be associated with irregular bleeding patterns.  · The copper IUD can make your menstrual flow heavier and more painful.  · You may experience cramping and vaginal bleeding after insertion.  Document Released: 02/02/2004 Document Revised: 05/23/2011 Document Reviewed: 07/03/2010  Exit[employer]® Patient Information ©2013 [employer].

## 2012-01-13 NOTE — Progress Notes (Signed)
31 YO for IUD removal because of recent gallbladder attack (also has gallstones). Reviewed other hormonal contraceptives relaying the effect of combination hormonal therapy on gallbladder disease.  Patient thinks she may want Paragard-reviewed.  O: Pelvic: EGBUS-wnl, vagina-blood, cervix-IUD removed without difficulty  A: IUD Removal  P: IUD information form given      To obtain cost of PARAGARD      RTO-as scheduled or prn  Jais Demir, PA-C

## 2012-01-13 NOTE — Telephone Encounter (Signed)
Tc to pt per telephone call. Pt c/o irregular bldg s/p IUD insertion x 3 weeks ago. Pt no bldg heavily, no abn abd pain, or fever. Pt recently dx with gallstones and rec to have gall bladder removed. Pt states,"has done research and found Mirena hormones can cause issues with the gall bladder". Pt wants IUD removed today. Appt sched today @ 2:45 with EP. Pt agrees.

## 2012-01-14 ENCOUNTER — Emergency Department (HOSPITAL_COMMUNITY): Payer: Medicaid Other

## 2012-01-14 ENCOUNTER — Encounter (HOSPITAL_COMMUNITY): Payer: Self-pay | Admitting: Emergency Medicine

## 2012-01-14 DIAGNOSIS — Z6841 Body Mass Index (BMI) 40.0 and over, adult: Secondary | ICD-10-CM

## 2012-01-14 DIAGNOSIS — K8010 Calculus of gallbladder with chronic cholecystitis without obstruction: Secondary | ICD-10-CM

## 2012-01-14 DIAGNOSIS — R1011 Right upper quadrant pain: Secondary | ICD-10-CM | POA: Diagnosis present

## 2012-01-14 DIAGNOSIS — K8020 Calculus of gallbladder without cholecystitis without obstruction: Secondary | ICD-10-CM | POA: Diagnosis present

## 2012-01-14 DIAGNOSIS — N182 Chronic kidney disease, stage 2 (mild): Secondary | ICD-10-CM | POA: Diagnosis present

## 2012-01-14 DIAGNOSIS — K859 Acute pancreatitis without necrosis or infection, unspecified: Secondary | ICD-10-CM

## 2012-01-14 LAB — URINALYSIS, ROUTINE W REFLEX MICROSCOPIC
Glucose, UA: NEGATIVE mg/dL
Ketones, ur: NEGATIVE mg/dL
Protein, ur: 30 mg/dL — AB

## 2012-01-14 LAB — URINE MICROSCOPIC-ADD ON

## 2012-01-14 LAB — CBC
HCT: 36.2 % (ref 36.0–46.0)
Hemoglobin: 12.6 g/dL (ref 12.0–15.0)
MCH: 27.8 pg (ref 26.0–34.0)
MCHC: 34.8 g/dL (ref 30.0–36.0)
MCV: 79.7 fL (ref 78.0–100.0)

## 2012-01-14 LAB — COMPREHENSIVE METABOLIC PANEL
ALT: 532 U/L — ABNORMAL HIGH (ref 0–35)
AST: 354 U/L — ABNORMAL HIGH (ref 0–37)
Albumin: 4.1 g/dL (ref 3.5–5.2)
Alkaline Phosphatase: 285 U/L — ABNORMAL HIGH (ref 39–117)
Chloride: 102 mEq/L (ref 96–112)
Potassium: 3.7 mEq/L (ref 3.5–5.1)
Sodium: 140 mEq/L (ref 135–145)
Total Bilirubin: 3.8 mg/dL — ABNORMAL HIGH (ref 0.3–1.2)
Total Protein: 8.1 g/dL (ref 6.0–8.3)

## 2012-01-14 LAB — PREGNANCY, URINE: Preg Test, Ur: NEGATIVE

## 2012-01-14 MED ORDER — ONDANSETRON HCL 4 MG/2ML IJ SOLN
4.0000 mg | Freq: Once | INTRAMUSCULAR | Status: AC
  Administered 2012-01-14: 4 mg via INTRAVENOUS

## 2012-01-14 MED ORDER — SODIUM CHLORIDE 0.9 % IV SOLN
INTRAVENOUS | Status: DC
  Administered 2012-01-14: 13:00:00 via INTRAVENOUS

## 2012-01-14 MED ORDER — HYDROMORPHONE HCL PF 1 MG/ML IJ SOLN: 0.5000 mg | INTRAMUSCULAR | Status: DC | PRN

## 2012-01-14 MED ORDER — ONDANSETRON HCL 4 MG/2ML IJ SOLN
INTRAMUSCULAR | Status: AC
  Filled 2012-01-14: qty 2

## 2012-01-14 MED ORDER — ONDANSETRON HCL 4 MG/2ML IJ SOLN: 4.0000 mg | Freq: Four times a day (QID) | INTRAMUSCULAR | Status: DC | PRN

## 2012-01-14 MED ORDER — PRENATAL MULTIVITAMIN CH
1.0000 | ORAL_TABLET | Freq: Every morning | ORAL | Status: DC
  Administered 2012-01-14 – 2012-01-16 (×3): 1 via ORAL
  Filled 2012-01-14 (×3): qty 1

## 2012-01-14 MED ORDER — ACETAMINOPHEN 325 MG PO TABS: 650.0000 mg | ORAL_TABLET | Freq: Four times a day (QID) | ORAL | Status: DC | PRN

## 2012-01-14 MED ORDER — ONDANSETRON HCL 4 MG PO TABS
4.0000 mg | ORAL_TABLET | Freq: Four times a day (QID) | ORAL | Status: DC | PRN
  Administered 2012-01-15: 4 mg via ORAL
  Filled 2012-01-14: qty 1

## 2012-01-14 MED ORDER — ACETAMINOPHEN 650 MG RE SUPP: 650.0000 mg | Freq: Four times a day (QID) | RECTAL | Status: DC | PRN

## 2012-01-14 MED ORDER — HYDROMORPHONE HCL PF 1 MG/ML IJ SOLN
1.0000 mg | Freq: Once | INTRAMUSCULAR | Status: AC
  Administered 2012-01-14: 1 mg via INTRAVENOUS
  Filled 2012-01-14: qty 1

## 2012-01-14 MED ORDER — HYDROCODONE-ACETAMINOPHEN 5-325 MG PO TABS: 1.0000 | ORAL_TABLET | ORAL | Status: DC | PRN

## 2012-01-14 MED ORDER — MORPHINE SULFATE 2 MG/ML IJ SOLN: 1.0000 mg | INTRAMUSCULAR | Status: DC | PRN

## 2012-01-14 MED ORDER — SODIUM CHLORIDE 0.9 % IV BOLUS (SEPSIS)
1000.0000 mL | Freq: Once | INTRAVENOUS | Status: AC
  Administered 2012-01-14: 1000 mL via INTRAVENOUS

## 2012-01-14 MED ORDER — ONDANSETRON HCL 4 MG/2ML IJ SOLN
4.0000 mg | Freq: Once | INTRAMUSCULAR | Status: AC
  Administered 2012-01-14: 4 mg via INTRAVENOUS
  Filled 2012-01-14: qty 2
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
## 2012-01-14 NOTE — ED Notes (Signed)
Attempted to call report to Elijah Birk, Charity fundraiser. Unavailable at this time and requested to call back in 10 minutes to give report

## 2012-01-14 NOTE — ED Notes (Signed)
Pt alert, arrives from home, c/o gall stone, pt seen in ED on Wednesday, instructed to f/u with sx center, sx to to be scheduled, resp even unlabored, skin pwd, states "unable to keep anything down"

## 2012-01-14 NOTE — ED Notes (Signed)
PA at bedside.
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
## 2012-01-15 DIAGNOSIS — Z6841 Body Mass Index (BMI) 40.0 and over, adult: Secondary | ICD-10-CM

## 2012-01-15 DIAGNOSIS — R7989 Other specified abnormal findings of blood chemistry: Secondary | ICD-10-CM

## 2012-01-15 DIAGNOSIS — K8020 Calculus of gallbladder without cholecystitis without obstruction: Secondary | ICD-10-CM

## 2012-01-15 LAB — COMPREHENSIVE METABOLIC PANEL
AST: 91 U/L — ABNORMAL HIGH (ref 0–37)
BUN: 9 mg/dL (ref 6–23)
CO2: 24 mEq/L (ref 19–32)
Calcium: 9.5 mg/dL (ref 8.4–10.5)
Chloride: 106 mEq/L (ref 96–112)
Creatinine, Ser: 1.08 mg/dL (ref 0.50–1.10)
GFR calc non Af Amer: 69 mL/min — ABNORMAL LOW (ref >90)
Total Bilirubin: 0.8 mg/dL (ref 0.3–1.2)

## 2012-01-15 LAB — CBC
Hemoglobin: 11.9 g/dL — ABNORMAL LOW (ref 12.0–15.0)
Platelets: 201 10*3/uL (ref 150–400)
RBC: 4.22 MIL/uL (ref 3.87–5.11)
WBC: 5.3 10*3/uL (ref 4.0–10.5)

## 2012-01-15 LAB — LIPASE, BLOOD: Lipase: 45 U/L (ref 11–59)

## 2012-01-15 MED ORDER — KCL IN DEXTROSE-NACL 20-5-0.45 MEQ/L-%-% IV SOLN
INTRAVENOUS | Status: DC
  Administered 2012-01-15: 22:00:00 via INTRAVENOUS
  Filled 2012-01-15 (×3): qty 1000

## 2012-01-15 NOTE — Progress Notes (Signed)
Patient ID: Yesenia George, female   DOB: 1980-04-07, 31 y.o.   MRN: [id number] [facility] Gastroenterology Progress Note  Subjective: The patient's abdominal pain is much improved related to a scale of 1-10  Objective: Vital signs in last 24 hours: Temp:  [97.4 F (36.3 C)-98.6 F (37 C)] 98.6 F (37 C) (11/03 0600) Pulse Rate:  [51-68] 63  (11/03 0600) Resp:  [16-18] 18  (11/03 0600) BP: (112-131)/(73-86) 130/83 mmHg (11/03 0600) SpO2:  [96 %-99 %] 99 % (11/03 0600) Weight:  [125 kg (275 lb 9.2 oz)] 125 kg (275 lb 9.2 oz) (11/03 0600) Weight change: 2.075 kg (4 lb 9.2 oz)   PE: Exam not done today  Lab Results: Results for orders placed during the hospital encounter of 01/13/12 (from the past 24 hour(s))  COMPREHENSIVE METABOLIC PANEL     Status: Abnormal   Collection Time   01/15/12  6:15 AM      Component Value Range   Sodium 140  135 - 145 mEq/L   Potassium 3.7  3.5 - 5.1 mEq/L   Chloride 106  96 - 112 mEq/L   CO2 24  19 - 32 mEq/L   Glucose, Bld 84  70 - 99 mg/dL   BUN 9  6 - 23 mg/dL   Creatinine, Ser 3.08  0.50 - 1.10 mg/dL   Calcium 9.5  8.4 - 65.7 mg/dL   Total Protein 6.9  6.0 - 8.3 g/dL   Albumin 3.5  3.5 - 5.2 g/dL   AST 91 (*) 0 - 37 U/L   ALT 302 (*) 0 - 35 U/L   Alkaline Phosphatase 238 (*) 39 - 117 U/L   Total Bilirubin 0.8  0.3 - 1.2 mg/dL   GFR calc non Af Amer 69 (*) >90 mL/min   GFR calc Af Amer 80 (*) >90 mL/min  CBC     Status: Abnormal   Collection Time   01/15/12  6:15 AM      Component Value Range   WBC 5.3  4.0 - 10.5 K/uL   RBC 4.22  3.87 - 5.11 MIL/uL   Hemoglobin 11.9 (*) 12.0 - 15.0 g/dL   HCT 84.6 (*) 96.2 - 95.2 %   MCV 81.0  78.0 - 100.0 fL   MCH 28.2  26.0 - 34.0 pg   MCHC 34.8  30.0 - 36.0 g/dL   RDW 84.1  32.4 - 40.1 %   Platelets 201  150 - 400 K/uL  LIPASE, BLOOD     Status: Normal   Collection Time   01/15/12  6:15 AM      Component Value Range   Lipase 45  11 - 59 U/L    Studies/Results: US Abdomen Limited  01/14/2012   *RADIOLOGY REPORT*  Clinical Data:  Right upper quadrant pain.  LIMITED ABDOMINAL ULTRASOUND - RIGHT UPPER QUADRANT  Comparison:  01/11/2012 from [facility].  Findings:  Gallbladder:  Tiny mobile stones in the dependent portion of the gallbladder.  No evidence of sludge, gallbladder wall thickening, or pericholecystic edema.  Murphy's sign is negative.  Appearance is similar to previous study.  Common bile duct:  Normal caliber with measured diameter of 3.6 mm.  Liver:  Normal homogeneous liver parenchymal echotexture.  No focal liver lesions appreciated.  IMPRESSION: Cholelithiasis.  Examination is otherwise unremarkable.  No significant change since previous study.                    Original Report Authenticated By:  Burman Nieves, M.D.       Assessment: Symptomatic cholelithiasis with probable choledocholithiasis with following liver function tests and improving pain.  Plan: Will discuss with surgery, leaning toward proceeding to laparoscopic cholecystectomy with intraoperative cholangiogram with ERCP reserved for positive findings    Dilia Alemany C 01/15/2012, 9:40 AM

## 2012-01-15 NOTE — Progress Notes (Signed)
General Surgery Note  LOS: 2 days  Room - 1516  Assessment/Plan: 1.  Cholelithiasis  I and Dr. Clovis Pu have discussed with the patient the indications and risks of gall bladder surgery.  The primary risks of gall bladder surgery include, but are not limited to, bleeding, infection, common bile duct injury, and open surgery.  There is also the risk that the patient may have continued symptoms after surgery.  However, the likelihood of improvement in symptoms and return to the patient's normal status is good.   Her husband is in the room.  Will set her up for surgery tomorrow.  I discussed with the patient that Dr. Gerrit Friends is our surgeon of the week and that her surgery will depend on other cases posted.  She wants surgery during this hospitalization.  2.  Pancreatitis, probably secondary to gall stones.   Lipase - 45 - 01/15/2012 3.  Elevated LFTs - all better  T. Bili - 0.8, AST - 91, Alk Phos - 238 - 01/15/2012  Discussed with Dr. Madilyn Fireman.  So he will hold off on ERCP and we will do cholecystectomy first.  4.  Sickle cell trait  5.  CKD (chronic kidney disease), stage II (01/14/2012) 6.  Morbid obesity with BMI of 40.0-44.9, adult (01/14/2012)   Subjective:  She is essentially having no symptoms.  Her husband is in the room. Objective:   Filed Vitals:   01/15/12 0600  BP: 130/83  Pulse: 63  Temp: 98.6 F (37 C)  Resp: 18     Intake/Output from previous day:  11/02 0701 - 11/03 0700 In: 360 [P.O.:360] Out: -   Intake/Output this shift:      Physical Exam:   General: WN obese AA F who is alert and oriented.    HEENT: Normal. Pupils equal. .   Lungs: Clear   Abdomen: Soft. BS present.   Lab Results:    [facility] 01/15/12 0615 01/14/12 0108  WBC 5.3 6.6  HGB 11.9* 12.6  HCT 34.2* 36.2  PLT 201 233    BMET   Basename 01/15/12 0615 01/14/12 0108  NA 140 140  K 3.7 3.7  CL 106 102  CO2 24 29  GLUCOSE 84 98  BUN 9 14  CREATININE 1.08 1.18*  CALCIUM 9.5 10.0     PT/INR  No results found for this basename: LABPROT:2,INR:2 in the last 72 hours  ABG  No results found for this basename: PHART:2,PCO2:2,PO2:2,HCO3:2 in the last 72 hours   Studies/Results:  US Abdomen Limited  01/14/2012  *RADIOLOGY REPORT*  Clinical Data:  Right upper quadrant pain.  LIMITED ABDOMINAL ULTRASOUND - RIGHT UPPER QUADRANT  Comparison:  01/11/2012 from [facility].  Findings:  Gallbladder:  Tiny mobile stones in the dependent portion of the gallbladder.  No evidence of sludge, gallbladder wall thickening, or pericholecystic edema.  Murphy's sign is negative.  Appearance is similar to previous study.  Common bile duct:  Normal caliber with measured diameter of 3.6 mm.  Liver:  Normal homogeneous liver parenchymal echotexture.  No focal liver lesions appreciated.  IMPRESSION: Cholelithiasis.  Examination is otherwise unremarkable.  No significant change since previous study.                    Original Report Authenticated By: Burman Nieves, M.D.      Anti-infectives:   Anti-infectives    None      Ovidio Kin, MD, FACS Pager: [phone],   [facility] Surgery Office: [phone] 01/15/2012

## 2012-01-15 NOTE — Progress Notes (Signed)
Subjective: Abdominal pain improved.  No specific concerns.  Objective: Vital signs in last 24 hours: Filed Vitals:   01/14/12 1051 01/14/12 1408 01/14/12 2200 01/15/12 0600  BP: 131/84 128/86 112/73 130/83  Pulse: 65 51 68 63  Temp: 97.6 F (36.4 C) 98.6 F (37 C) 97.4 F (36.3 C) 98.6 F (37 C)  TempSrc: Oral Oral Oral Oral  Resp: 16 18 18 18   Height:      Weight:    125 kg (275 lb 9.2 oz)  SpO2: 98% 96% 96% 99%   Weight change: 2.075 kg (4 lb 9.2 oz)  Intake/Output Summary (Last 24 hours) at 01/15/12 0848 Last data filed at 01/14/12 1627  Gross per 24 hour  Intake    360 ml  Output      0 ml  Net    360 ml    Physical Exam: General: Awake, Oriented, No acute distress. HEENT: EOMI. Neck: Supple CV: S1 and S2 Lungs: Clear to ascultation bilaterally Abdomen: Soft, Nontender, Nondistended, +bowel sounds. Ext: Good pulses. Trace edema.  Lab Results: Basic Metabolic Panel:  Lab 01/15/12 1610 01/14/12 0108 01/11/12 1235  NA 140 140 142  K 3.7 3.7 4.1  CL 106 102 105  CO2 24 29 29   GLUCOSE 84 98 98  BUN 9 14 15   CREATININE 1.08 1.18* 1.15*  CALCIUM 9.5 10.0 9.9  MG -- -- --  PHOS -- -- --   Liver Function Tests:  Lab 01/15/12 0615 01/14/12 0108 01/11/12 1235  AST 91* 354* 46*  ALT 302* 532* 29  ALKPHOS 238* 285* 86  BILITOT 0.8 3.8* 0.4  PROT 6.9 8.1 7.6  ALBUMIN 3.5 4.1 3.9    Lab 01/15/12 0615 01/14/12 0108 01/11/12 1235  LIPASE 45 62* 49  AMYLASE -- -- --   No results found for this basename: AMMONIA:5 in the last 168 hours CBC:  Lab 01/15/12 0615 01/14/12 0108 01/11/12 1235  WBC 5.3 6.6 8.7  NEUTROABS -- -- 6.6  HGB 11.9* 12.6 12.2  HCT 34.2* 36.2 34.8*  MCV 81.0 79.7 80.6  PLT 201 233 189   Cardiac Enzymes: No results found for this basename: CKTOTAL:5,CKMB:5,CKMBINDEX:5,TROPONINI:5 in the last 168 hours BNP (last 3 results) No results found for this basename: PROBNP:3 in the last 8760 hours CBG: No results found for this basename:  GLUCAP:5 in the last 168 hours No results found for this basename: HGBA1C:5 in the last 72 hours Other Labs: No components found with this basename: POCBNP:3 No results found for this basename: DDIMER:2 in the last 168 hours No results found for this basename: CHOL:2,HDL:2,LDLCALC:2,TRIG:2,CHOLHDL:2,LDLDIRECT:2 in the last 168 hours No results found for this basename: TSH,T4TOTAL,FREET3,T3FREE,FREET4,THYROIDAB in the last 168 hours No results found for this basename: VITAMINB12:2,FOLATE:2,FERRITIN:2,TIBC:2,IRON:2,RETICCTPCT:2 in the last 168 hours  Micro Results: No results found for this or any previous visit (from the past 240 hour(s)).  Studies/Results: US Abdomen Limited  01/14/2012  *RADIOLOGY REPORT*  Clinical Data:  Right upper quadrant pain.  LIMITED ABDOMINAL ULTRASOUND - RIGHT UPPER QUADRANT  Comparison:  01/11/2012 from [facility].  Findings:  Gallbladder:  Tiny mobile stones in the dependent portion of the gallbladder.  No evidence of sludge, gallbladder wall thickening, or pericholecystic edema.  Murphy's sign is negative.  Appearance is similar to previous study.  Common bile duct:  Normal caliber with measured diameter of 3.6 mm.  Liver:  Normal homogeneous liver parenchymal echotexture.  No focal liver lesions appreciated.  IMPRESSION: Cholelithiasis.  Examination is otherwise unremarkable.  No significant change since previous study.                    Original Report Authenticated By: Burman Nieves, M.D.     Medications: I have reviewed the patient's current medications. Scheduled Meds:   . prenatal multivitamin  1 tablet Oral q morning - 10a   Continuous Infusions:   . sodium chloride 75 mL/hr at 01/14/12 1230   PRN Meds:.acetaminophen, acetaminophen, HYDROcodone-acetaminophen, morphine injection, ondansetron (ZOFRAN) IV, ondansetron, [DISCONTINUED]  HYDROmorphone (DILAUDID) injection  Assessment/Plan: Right upper quadrant abdominal pain likely due to  cholelithiasis  Patient had abdominal ultrasound on 01/14/2012 which showed normal caliber common bile duct measuring 3.6 mm, ultrasound also showed tiny mobile stones in the gallbladder, no evidence of gallbladder wall thickening or edema. General surgery and GI onboard.  Given improvement in liver function tests, general surgery planning on surgery tomorrow for laparoscopic cholecystectomy and intraoperative cholangiogram.  Elevated liver function tests  As indicated above.  Improved.   Mild pancreatitis  From gallstones.  Improved.  On full liquid diet and n.p.o. after midnight for any procedures.   Chronic kidney disease stage II  Creatinine at baseline.   Status post 9 weeks postpartum currently breast-feeding  Avoid any medications that could potentially be transferred through breast-feeding.   Morbid obesity  Diet and exercise as outpatient.   Prophylaxis  SCDs.   CODE STATUS  Full code.   Disposition  Pending surgery. Discussed with Dr. Ezzard Standing, patient to be on general surgery service after surgery as patient has no active medical issues at this time.   LOS: 2 days  Dhalia Zingaro A, MD 01/15/2012, 8:48 AM

## 2012-01-16 ENCOUNTER — Encounter (HOSPITAL_COMMUNITY)
Admission: EM | Disposition: A | Discharge status: Home / Self Care | Payer: Self-pay | Source: Home / Self Care | Attending: Internal Medicine

## 2012-01-16 ENCOUNTER — Inpatient Hospital Stay (HOSPITAL_COMMUNITY): Payer: Medicaid Other | Admitting: Anesthesiology

## 2012-01-16 ENCOUNTER — Encounter (HOSPITAL_COMMUNITY): Payer: Self-pay | Admitting: Surgery

## 2012-01-16 ENCOUNTER — Inpatient Hospital Stay (HOSPITAL_COMMUNITY): Payer: Medicaid Other

## 2012-01-16 ENCOUNTER — Encounter (HOSPITAL_COMMUNITY): Payer: Self-pay | Admitting: Anesthesiology

## 2012-01-16 DIAGNOSIS — N182 Chronic kidney disease, stage 2 (mild): Secondary | ICD-10-CM

## 2012-01-16 HISTORY — PX: CHOLECYSTECTOMY: SHX55

## 2012-01-16 LAB — COMPREHENSIVE METABOLIC PANEL
ALT: 206 U/L — ABNORMAL HIGH (ref 0–35)
AST: 54 U/L — ABNORMAL HIGH (ref 0–37)
Albumin: 3.4 g/dL — ABNORMAL LOW (ref 3.5–5.2)
Alkaline Phosphatase: 199 U/L — ABNORMAL HIGH (ref 39–117)
CO2: 24 mEq/L (ref 19–32)
Chloride: 104 mEq/L (ref 96–112)
GFR calc non Af Amer: 73 mL/min — ABNORMAL LOW (ref >90)
Potassium: 4.3 mEq/L (ref 3.5–5.1)
Sodium: 137 mEq/L (ref 135–145)
Total Bilirubin: 0.6 mg/dL (ref 0.3–1.2)

## 2012-01-16 LAB — SURGICAL PCR SCREEN: Staphylococcus aureus: POSITIVE — AB

## 2012-01-16 SURGERY — LAPAROSCOPIC CHOLECYSTECTOMY WITH INTRAOPERATIVE CHOLANGIOGRAM
Anesthesia: General | Site: Abdomen | Wound class: Clean Contaminated

## 2012-01-16 MED ORDER — METOCLOPRAMIDE HCL 5 MG/ML IJ SOLN
INTRAMUSCULAR | Status: DC | PRN
  Administered 2012-01-16: 10 mg via INTRAVENOUS

## 2012-01-16 MED ORDER — 0.9 % SODIUM CHLORIDE (POUR BTL) OPTIME
TOPICAL | Status: DC | PRN
  Administered 2012-01-16: 1000 mL

## 2012-01-16 MED ORDER — KCL IN DEXTROSE-NACL 20-5-0.45 MEQ/L-%-% IV SOLN
INTRAVENOUS | Status: DC
  Administered 2012-01-16: 23:00:00 via INTRAVENOUS
  Filled 2012-01-16 (×3): qty 1000

## 2012-01-16 MED ORDER — DEXAMETHASONE SODIUM PHOSPHATE 4 MG/ML IJ SOLN
INTRAMUSCULAR | Status: DC | PRN
  Administered 2012-01-16: 10 mg via INTRAVENOUS

## 2012-01-16 MED ORDER — FENTANYL CITRATE 0.05 MG/ML IJ SOLN
INTRAMUSCULAR | Status: DC | PRN
  Administered 2012-01-16 (×9): 50 ug via INTRAVENOUS
  Administered 2012-01-16 (×2): 25 ug via INTRAVENOUS

## 2012-01-16 MED ORDER — KETAMINE HCL 10 MG/ML IJ SOLN
INTRAMUSCULAR | Status: DC | PRN
  Administered 2012-01-16 (×2): 1 mg via INTRAVENOUS
  Administered 2012-01-16: 30 mg via INTRAVENOUS
  Administered 2012-01-16 (×3): 1 mg via INTRAVENOUS

## 2012-01-16 MED ORDER — ONDANSETRON HCL 4 MG PO TABS: 4.0000 mg | ORAL_TABLET | Freq: Four times a day (QID) | ORAL | Status: DC | PRN

## 2012-01-16 MED ORDER — LACTATED RINGERS IV SOLN
INTRAVENOUS | Status: DC | PRN
  Administered 2012-01-16 (×2): via INTRAVENOUS

## 2012-01-16 MED ORDER — HYDROCODONE-ACETAMINOPHEN 5-325 MG PO TABS: 1.0000 | ORAL_TABLET | ORAL | Status: DC | PRN

## 2012-01-16 MED ORDER — ACETAMINOPHEN 325 MG PO TABS
650.0000 mg | ORAL_TABLET | ORAL | Status: DC | PRN
  Administered 2012-01-17: 650 mg via ORAL
  Filled 2012-01-16: qty 2

## 2012-01-16 MED ORDER — CIPROFLOXACIN IN D5W 400 MG/200ML IV SOLN: 400.0000 mg | INTRAVENOUS | Status: DC

## 2012-01-16 MED ORDER — BUPIVACAINE-EPINEPHRINE 0.5% -1:200000 IJ SOLN
INTRAMUSCULAR | Status: DC | PRN
  Administered 2012-01-16: 20 mL

## 2012-01-16 MED ORDER — MIDAZOLAM HCL 5 MG/5ML IJ SOLN
INTRAMUSCULAR | Status: DC | PRN
  Administered 2012-01-16: 2 mg via INTRAVENOUS

## 2012-01-16 MED ORDER — ROCURONIUM BROMIDE 100 MG/10ML IV SOLN
INTRAVENOUS | Status: DC | PRN
  Administered 2012-01-16: 30 mg via INTRAVENOUS

## 2012-01-16 MED ORDER — GLYCOPYRROLATE 0.2 MG/ML IJ SOLN
INTRAMUSCULAR | Status: DC | PRN
  Administered 2012-01-16: 0.6 mg via INTRAVENOUS

## 2012-01-16 MED ORDER — PROMETHAZINE HCL 25 MG/ML IJ SOLN: 6.2500 mg | INTRAMUSCULAR | Status: DC | PRN

## 2012-01-16 MED ORDER — LACTATED RINGERS IR SOLN
Status: DC | PRN
  Administered 2012-01-16: 1000 mL

## 2012-01-16 MED ORDER — IOHEXOL 300 MG/ML  SOLN
INTRAMUSCULAR | Status: DC | PRN
  Administered 2012-01-16: 7.5 mL

## 2012-01-16 MED ORDER — NEOSTIGMINE METHYLSULFATE 1 MG/ML IJ SOLN
INTRAMUSCULAR | Status: DC | PRN
  Administered 2012-01-16: 4 mg via INTRAVENOUS

## 2012-01-16 MED ORDER — HYDROMORPHONE HCL PF 1 MG/ML IJ SOLN
1.0000 mg | INTRAMUSCULAR | Status: DC | PRN
  Administered 2012-01-16 (×2): 1 mg via INTRAVENOUS
  Filled 2012-01-16 (×2): qty 1

## 2012-01-16 MED ORDER — PROPOFOL 10 MG/ML IV BOLUS
INTRAVENOUS | Status: DC | PRN
  Administered 2012-01-16: 150 mg via INTRAVENOUS

## 2012-01-16 MED ORDER — HYDROMORPHONE HCL PF 1 MG/ML IJ SOLN: 0.2500 mg | INTRAMUSCULAR | Status: DC | PRN

## 2012-01-16 MED ORDER — CIPROFLOXACIN IN D5W 400 MG/200ML IV SOLN
INTRAVENOUS | Status: DC | PRN
  Administered 2012-01-16: 400 mg via INTRAVENOUS

## 2012-01-16 MED ORDER — ONDANSETRON HCL 4 MG/2ML IJ SOLN
4.0000 mg | Freq: Four times a day (QID) | INTRAMUSCULAR | Status: DC | PRN
  Administered 2012-01-17: 4 mg via INTRAVENOUS
  Filled 2012-01-16: qty 2

## 2012-01-16 MED ORDER — SUCCINYLCHOLINE CHLORIDE 20 MG/ML IJ SOLN
INTRAMUSCULAR | Status: DC | PRN
  Administered 2012-01-16: 100 mg via INTRAVENOUS

## 2012-01-16 MED ORDER — ONDANSETRON HCL 4 MG/2ML IJ SOLN
INTRAMUSCULAR | Status: DC | PRN
  Administered 2012-01-16: 4 mg via INTRAVENOUS

## 2012-01-16 SURGICAL SUPPLY — 41 items
APPLIER CLIP ROT 10 11.4 M/L (STAPLE) ×2
BENZOIN TINCTURE PRP APPL 2/3 (GAUZE/BANDAGES/DRESSINGS) ×2 IMPLANT
CABLE HIGH FREQUENCY MONO STRZ (ELECTRODE) IMPLANT
CANISTER SUCTION 2500CC (MISCELLANEOUS) ×2 IMPLANT
CHLORAPREP W/TINT 26ML (MISCELLANEOUS) ×2 IMPLANT
CLIP APPLIE ROT 10 11.4 M/L (STAPLE) ×1 IMPLANT
CLOTH BEACON ORANGE TIMEOUT ST (SAFETY) ×2 IMPLANT
COVER MAYO STAND STRL (DRAPES) ×2 IMPLANT
DECANTER SPIKE VIAL GLASS SM (MISCELLANEOUS) ×2 IMPLANT
DRAPE C-ARM 42X72 X-RAY (DRAPES) ×2 IMPLANT
DRAPE LAPAROSCOPIC ABDOMINAL (DRAPES) ×2 IMPLANT
DRAPE UTILITY 15X26 (DRAPE) ×2 IMPLANT
ELECT REM PT RETURN 9FT ADLT (ELECTROSURGICAL) ×2
ELECTRODE REM PT RTRN 9FT ADLT (ELECTROSURGICAL) ×1 IMPLANT
GAUZE SPONGE 2X2 8PLY STRL LF (GAUZE/BANDAGES/DRESSINGS) ×2 IMPLANT
GLOVE BIOGEL PI IND STRL 7.0 (GLOVE) ×2 IMPLANT
GLOVE BIOGEL PI INDICATOR 7.0 (GLOVE) ×2
GLOVE SURG ORTHO 8.0 STRL STRW (GLOVE) IMPLANT
GLOVE SURG SS PI 8.5 STRL IVOR (GLOVE) ×2
GLOVE SURG SS PI 8.5 STRL STRW (GLOVE) ×2 IMPLANT
GOWN STRL NON-REIN LRG LVL3 (GOWN DISPOSABLE) ×2 IMPLANT
GOWN STRL REIN XL XLG (GOWN DISPOSABLE) ×4 IMPLANT
HEMOSTAT SURGICEL 4X8 (HEMOSTASIS) IMPLANT
KIT BASIN OR (CUSTOM PROCEDURE TRAY) ×2 IMPLANT
NS IRRIG 1000ML POUR BTL (IV SOLUTION) ×2 IMPLANT
POUCH SPECIMEN RETRIEVAL 10MM (ENDOMECHANICALS) ×2 IMPLANT
SCISSORS LAP 5X35 DISP (ENDOMECHANICALS) ×2 IMPLANT
SET CHOLANGIOGRAPH MIX (MISCELLANEOUS) ×2 IMPLANT
SET IRRIG TUBING LAPAROSCOPIC (IRRIGATION / IRRIGATOR) ×2 IMPLANT
SLEEVE Z-THREAD 5X100MM (TROCAR) ×2 IMPLANT
SOLUTION ANTI FOG 6CC (MISCELLANEOUS) ×2 IMPLANT
SPONGE GAUZE 2X2 STER 10/PKG (GAUZE/BANDAGES/DRESSINGS) ×2
STRIP CLOSURE SKIN 1/2X4 (GAUZE/BANDAGES/DRESSINGS) ×2 IMPLANT
SUT MNCRL AB 4-0 PS2 18 (SUTURE) ×2 IMPLANT
TAPE CLOTH SURG 4X10 WHT LF (GAUZE/BANDAGES/DRESSINGS) ×2 IMPLANT
TOWEL OR 17X26 10 PK STRL BLUE (TOWEL DISPOSABLE) ×6 IMPLANT
TRAY LAP CHOLE (CUSTOM PROCEDURE TRAY) ×2 IMPLANT
TROCAR XCEL BLUNT TIP 100MML (ENDOMECHANICALS) ×2 IMPLANT
TROCAR Z-THREAD FIOS 11X100 BL (TROCAR) ×2 IMPLANT
TROCAR Z-THREAD FIOS 5X100MM (TROCAR) ×4 IMPLANT
TUBING INSUFFLATION 10FT LAP (TUBING) ×2 IMPLANT

## 2012-01-16 NOTE — Progress Notes (Signed)
Subjective: No specific concerns. Abdominal pain improved. Would like to have surgery today if possible.  Objective: Vital signs in last 24 hours: Filed Vitals:   01/15/12 0600 01/15/12 1316 01/15/12 2200 01/16/12 0600  BP: 130/83 113/50 111/73 115/72  Pulse: 63 78 66 60  Temp: 98.6 F (37 C) 98.7 F (37.1 C) 98.4 F (36.9 C) 98.5 F (36.9 C)  TempSrc: Oral Oral Oral Oral  Resp: 18 18 18 18   Height:      Weight: 125 kg (275 lb 9.2 oz)   125.1 kg (275 lb 12.7 oz)  SpO2: 99% 98% 99% 99%   Weight change: 0.1 kg (3.5 oz)  Intake/Output Summary (Last 24 hours) at 01/16/12 0853 Last data filed at 01/15/12 1300  Gross per 24 hour  Intake    360 ml  Output      0 ml  Net    360 ml    Physical Exam: General: Awake, Oriented, No acute distress. HEENT: EOMI. Neck: Supple CV: S1 and S2 Lungs: Clear to ascultation bilaterally Abdomen: Soft, Nontender, Nondistended, +bowel sounds. Ext: Good pulses. Trace edema.  Lab Results: Basic Metabolic Panel:  Lab 01/16/12 1610 01/15/12 0615 01/14/12 0108 01/11/12 1235  NA 137 140 140 142  K 4.3 3.7 3.7 4.1  CL 104 106 102 105  CO2 24 24 29 29   GLUCOSE 100* 84 98 98  BUN 8 9 14 15   CREATININE 1.03 1.08 1.18* 1.15*  CALCIUM 9.4 9.5 10.0 9.9  MG -- -- -- --  PHOS -- -- -- --   Liver Function Tests:  Lab 01/16/12 0507 01/15/12 0615 01/14/12 0108 01/11/12 1235  AST 54* 91* 354* 46*  ALT 206* 302* 532* 29  ALKPHOS 199* 238* 285* 86  BILITOT 0.6 0.8 3.8* 0.4  PROT 6.9 6.9 8.1 7.6  ALBUMIN 3.4* 3.5 4.1 3.9    Lab 01/15/12 0615 01/14/12 0108 01/11/12 1235  LIPASE 45 62* 49  AMYLASE -- -- --   No results found for this basename: AMMONIA:5 in the last 168 hours CBC:  Lab 01/15/12 0615 01/14/12 0108 01/11/12 1235  WBC 5.3 6.6 8.7  NEUTROABS -- -- 6.6  HGB 11.9* 12.6 12.2  HCT 34.2* 36.2 34.8*  MCV 81.0 79.7 80.6  PLT 201 233 189   Cardiac Enzymes: No results found for this basename: CKTOTAL:5,CKMB:5,CKMBINDEX:5,TROPONINI:5  in the last 168 hours BNP (last 3 results) No results found for this basename: PROBNP:3 in the last 8760 hours CBG: No results found for this basename: GLUCAP:5 in the last 168 hours No results found for this basename: HGBA1C:5 in the last 72 hours Other Labs: No components found with this basename: POCBNP:3 No results found for this basename: DDIMER:2 in the last 168 hours No results found for this basename: CHOL:2,HDL:2,LDLCALC:2,TRIG:2,CHOLHDL:2,LDLDIRECT:2 in the last 168 hours No results found for this basename: TSH,T4TOTAL,FREET3,T3FREE,FREET4,THYROIDAB in the last 168 hours No results found for this basename: VITAMINB12:2,FOLATE:2,FERRITIN:2,TIBC:2,IRON:2,RETICCTPCT:2 in the last 168 hours  Micro Results: No results found for this or any previous visit (from the past 240 hour(s)).  Studies/Results: No results found.  Medications: I have reviewed the patient's current medications. Scheduled Meds:    . prenatal multivitamin  1 tablet Oral q morning - 10a   Continuous Infusions:    . dextrose 5 % and 0.45 % NaCl with KCl 20 mEq/L 100 mL/hr at 01/15/12 2200  . [DISCONTINUED] sodium chloride 75 mL/hr at 01/14/12 1230   PRN Meds:.acetaminophen, acetaminophen, HYDROcodone-acetaminophen, morphine injection, ondansetron (ZOFRAN) IV, ondansetron  Assessment/Plan: Right upper quadrant abdominal pain likely due to cholelithiasis  Patient had abdominal ultrasound on 01/14/2012 which showed normal caliber common bile duct measuring 3.6 mm, ultrasound also showed tiny mobile stones in the gallbladder, no evidence of gallbladder wall thickening or edema. General surgery and GI onboard.  Given improvement in liver function tests, general surgery planning on surgery today for laparoscopic cholecystectomy and intraoperative cholangiogram.  Elevated liver function tests  As indicated above.  Improved.   Mild pancreatitis  From gallstones.  Improved.   Chronic kidney disease stage II    Creatinine at baseline.   Status post 9 weeks postpartum currently breast-feeding  Avoid any medications that could potentially be transferred through breast-feeding.   Morbid obesity  Diet and exercise as outpatient.   Prophylaxis  SCDs.   CODE STATUS  Full code.   Disposition  Pending surgery, as patient is medically stable, general surgery to assume patient's care, will sign off. Please call us with any questions or concerns.    LOS: 3 days  Javyn Havlin A, MD 01/16/2012, 8:53 AM

## 2012-01-16 NOTE — Progress Notes (Signed)
  Subjective: Feels good this morning, awaiting surgery, No further c/o of N/V since remaining NPO. Husband at bedside.  Objective: Vital signs in last 24 hours: Temp:  [98.4 F (36.9 C)-98.7 F (37.1 C)] 98.5 F (36.9 C) (11/04 0600) Pulse Rate:  [60-78] 60  (11/04 0600) Resp:  [18] 18  (11/04 0600) BP: (111-115)/(50-73) 115/72 mmHg (11/04 0600) SpO2:  [98 %-99 %] 99 % (11/04 0600) Weight:  [275 lb 12.7 oz (125.1 kg)] 275 lb 12.7 oz (125.1 kg) (11/04 0600) Last BM Date: 01/13/12  Intake/Output from previous day: 11/03 0701 - 11/04 0700 In: 360 [P.O.:360] Out: -  Intake/Output this shift:    General appearance: alert, cooperative, appears stated age and no distress Chest: CTA Cardiac: RRR Abdomen: soft, mildly tender in RUQ, No distention  Extremities: No tenderness, edema, + pulses bilaterally. VSS, afebrile Labs: AST/ALT slowly trending down, (54/206) Tbili also improving (0.6) Lipase (45) BUN and Creat improving,   Lab Results:   [facility] 01/15/12 0615 01/14/12 0108  WBC 5.3 6.6  HGB 11.9* 12.6  HCT 34.2* 36.2  PLT 201 233   BMET  Basename 01/16/12 0507 01/15/12 0615  NA 137 140  K 4.3 3.7  CL 104 106  CO2 24 24  GLUCOSE 100* 84  BUN 8 9  CREATININE 1.03 1.08  CALCIUM 9.4 9.5   PT/INR No results found for this basename: LABPROT:2,INR:2 in the last 72 hours ABG No results found for this basename: PHART:2,PCO2:2,PO2:2,HCO3:2 in the last 72 hours  Studies/Results: No results found.  Anti-infectives: Anti-infectives    None      Assessment/Plan: s/p * No surgery found * 1. Continue NPO status pending surgery today for laparoscopic cholecystectomy. 2. Continue IVF, DVT and GERD prophylaxis   LOS: 3 days    Avram Danielson 01/16/2012

## 2012-01-16 NOTE — Anesthesia Postprocedure Evaluation (Signed)
  Anesthesia Post-op Note  Patient: Yesenia George  Procedure(s) Performed: Procedure(s) (LRB): LAPAROSCOPIC CHOLECYSTECTOMY WITH INTRAOPERATIVE CHOLANGIOGRAM (N/A)  Patient Location: PACU  Anesthesia Type: General  Level of Consciousness: awake and alert   Airway and Oxygen Therapy: Patient Spontanous Breathing  Post-op Pain: mild  Post-op Assessment: Post-op Vital signs reviewed, Patient's Cardiovascular Status Stable, Respiratory Function Stable, Patent Airway and No signs of Nausea or vomiting  Post-op Vital Signs: stable  Complications: No apparent anesthesia complications

## 2012-01-16 NOTE — Progress Notes (Signed)
Patient received back from OR.  Report given at bedside by PACU RN Casimiro Needle.  Patient is awake and alert.  Patient does not complain of any pain at this time.  Dressings to right upper and lower abdomen and at umbilicus dry and intact.  There is some shadowing on the dressing on the umbilicus.  Patient is afebrile.  Patient resting comfortably.  Will continue to monitor.

## 2012-01-16 NOTE — Progress Notes (Signed)
General Surgery [facility]  Patient seen and examined.  Discussed proposed procedure.  Will put on OR schedule for today for lap chole with IOC.  I will perform her surgery, or if delayed until evening, my partner, Dr. Harden Mo, will perform her surgery.  Patient understands and has no further questions.  Agrees to proceed.  Velora Heckler, MD, [facility] Office: [phone]

## 2012-01-16 NOTE — Interval H&P Note (Signed)
History and Physical Interval Note:  01/16/2012 3:51 PM  Yesenia George  has presented today for surgery, with the diagnosis of cholelithiasis.  The various methods of treatment have been discussed with the patient and family. After consideration of risks, benefits and other options for treatment, the patient has consented to    Procedure(s) (LRB) with comments: LAPAROSCOPIC CHOLECYSTECTOMY WITH INTRAOPERATIVE CHOLANGIOGRAM (N/A) as a surgical intervention .    The patient's history has been reviewed, patient examined, no change in status, stable for surgery.  I have reviewed the patient's chart and labs.  Questions were answered to the patient's satisfaction.    Velora Heckler, MD, [facility] Office: [phone]   Orlandus Borowski Judie Petit

## 2012-01-16 NOTE — Op Note (Signed)
Laparoscopic Cholecystectomy with IOC Procedure Note  Pre-operative Diagnosis: Calculus of gallbladder with other cholecystitis and obstruction  Post-operative Diagnosis: Calculus of gallbladder with other cholecystitis, without mention of obstruction  Surgeon:  Velora Heckler, MD, FACS  Assistant:  none   Anesthesia:  General  Indications: This patient presents with symptomatic gallbladder disease and will undergo laparoscopic cholecystectomy with intraoperative cholangiography.  Procedure Details  The patient was seen in the pre-op holding area. The risks, benefits, complications, treatment options, and expected outcomes have been discussed with the patient. The patient and/or family agreed with the proposed plan and signed the informed consent form.  The patient was taken to Operating Room, identified as Yesenia George and the procedure verified as Laparoscopic Cholecystectomy with Intraoperative Cholangiogram. A "time out" was completed and the above information confirmed.  Prior to the induction of general anesthesia, antibiotic prophylaxis was administered. General endotracheal anesthesia was then administered and tolerated well. After the induction, the abdomen was prepped in the usual aseptic fashion. The patient was positioned in the supine position.  An incision was made in the skin near the umbilicus. The midline fascia was incised and the peritoneal cavity entered and the Hasson canula was introduced under direct vision. Hasson canula was secured with a pursestring 0-Vicryl suture. Pneumoperitoneum was then established with carbon dioxide and tolerated well without any adverse changes in the patient's vital signs. Additional trocars were introduced under direct vision along the right costal margin in the midline, mid-clavicular line, and anterior axillary line.  The gallbladder was identified and the fundus grasped and retracted cephalad. Adhesions were taken down bluntly and  with the electrocautery as needed, taking care not to injure any adjacent structures. The infundibulum was grasped and retracted laterally, exposing the peritoneum overlying the triangle of Calot. This was incised and structures exposed in a blunt fashion. The cystic duct was clearly identified and bluntly dissected circumferentially.  An incision was made in the cystic duct and the cholangiogram catheter introduced. The catheter was secured using an ligaclip.  Real-time cholangiography was performed using the C-arm.  There was rapid filling of a normal caliber common bile duct.  There was reflux of contrast into the left and right hepatic ductal systems.  There was free flow distally into the duodenum without filling defect or obstruction.  Catheter was removed from the peritoneal cavity.  The cystic duct was then triply ligated with surgical clips on the patient side and singly clipped on the gallbladder side and divided. The cystic artery was identified, dissected circumferentially, ligated with ligaclips, and divided.   The gallbladder was dissected from the liver bed with the electrocautery used for hemostasis. The gallbladder was completely removed and placed into an endocatch bag. The right upper quadrant was irrigated and inspected. Hemostasis was achieved with the electrocautery. Warm saline irrigation was utilized and was repeatedly aspirated until clear.  Pneumoperitoneum was released after viewing removal of the trocars with good hemostasis. The umbilical wound was irrigated and the fascia was then closed with the pursestring suture.  The skin was then closed with 4-0 Monocril subcuticular sutures and a sterile dressing was applied.  Instrument, sponge, and needle counts were correct at the conclusion of the case.  Findings: Cholecystitis with Cholelithiasis  Estimated Blood Loss: Minimal         Drains: none         Specimens: Gallbladder to pathology       Complications: None;  patient tolerated the procedure well.  Disposition: PACU - hemodynamically stable.         Condition: stable  Velora Heckler, MD, [facility] Office: [phone]

## 2012-01-16 NOTE — Anesthesia Preprocedure Evaluation (Signed)
Anesthesia Evaluation  Patient identified by MRN, date of birth, ID band Patient awake    Reviewed: Allergy & Precautions, H&P , NPO status , Patient's Chart, lab work & pertinent test results  Airway Mallampati: II TM Distance: <3 FB Neck ROM: Full    Dental No notable dental hx.    Pulmonary neg pulmonary ROS,  breath sounds clear to auscultation  + decreased breath sounds      Cardiovascular Rhythm:Regular Rate:Normal     Neuro/Psych negative neurological ROS  negative psych ROS   GI/Hepatic negative GI ROS, Neg liver ROS,   Endo/Other  negative endocrine ROSMorbid obesity  Renal/GU Renal Insufficiencynegative Renal ROS  negative genitourinary   Musculoskeletal negative musculoskeletal ROS (+)   Abdominal   Peds negative pediatric ROS (+)  Hematology negative hematology ROS (+) Sickle cell trait   Anesthesia Other Findings   Reproductive/Obstetrics negative OB ROS                           Anesthesia Physical Anesthesia Plan  ASA: III  Anesthesia Plan: General   Post-op Pain Management:    Induction: Intravenous  Airway Management Planned: Oral ETT  Additional Equipment:   Intra-op Plan:   Post-operative Plan: Extubation in OR  Informed Consent: I have reviewed the patients History and Physical, chart, labs and discussed the procedure including the risks, benefits and alternatives for the proposed anesthesia with the patient or authorized representative who has indicated his/her understanding and acceptance.   Dental advisory given  Plan Discussed with: CRNA and Surgeon  Anesthesia Plan Comments:         Anesthesia Quick Evaluation

## 2012-01-16 NOTE — Progress Notes (Signed)
IV to right antecube infilatrated.  Area is swollen, no redness no pain.  IV removed.  Ice pack applied to right arm then heating pad.  IV team called to to start line.  Will monitor.

## 2012-01-16 NOTE — Transfer of Care (Signed)
Immediate Anesthesia Transfer of Care Note  Patient: Yesenia George  Procedure(s) Performed: Procedure(s) (LRB) with comments: LAPAROSCOPIC CHOLECYSTECTOMY WITH INTRAOPERATIVE CHOLANGIOGRAM (N/A)  Patient Location: PACU  Anesthesia Type:General  Level of Consciousness: awake, alert , oriented, patient cooperative and responds to stimulation  Airway & Oxygen Therapy: Patient Spontanous Breathing and Patient connected to face mask oxygen  Post-op Assessment: Report given to PACU RN, Post -op Vital signs reviewed and stable and Patient moving all extremities  Post vital signs: Reviewed and stable  Complications: No apparent anesthesia complications

## 2012-01-16 NOTE — Progress Notes (Signed)
Patient discussed with Dr. Madilyn Fireman and chart reviewed and will be on standby if IOC positive and please let us know

## 2012-01-16 NOTE — Preoperative (Signed)
Beta Blockers   Reason not to administer Beta Blockers:Not Applicable, not on home BB 

## 2012-01-16 NOTE — Progress Notes (Signed)
Due to patient receiving antibiotics and drugs such as fentanyl in the OR, patient has been reminded to pump her breast milk and dump it for 24 hours. Patient verbalizes understanding.  Will monitor.

## 2012-01-17 ENCOUNTER — Encounter (HOSPITAL_COMMUNITY): Payer: Self-pay | Admitting: Surgery

## 2012-01-17 MED ORDER — ONDANSETRON HCL 4 MG PO TABS: 4.0000 mg | ORAL_TABLET | Freq: Four times a day (QID) | ORAL | Status: DC | PRN

## 2012-01-17 MED ORDER — ACETAMINOPHEN 325 MG PO TABS: 650.0000 mg | ORAL_TABLET | ORAL | Status: DC | PRN

## 2012-01-17 NOTE — ED Provider Notes (Signed)
History/physical exam/procedure(s) were performed by non-physician practitioner and as supervising physician I was immediately available for consultation/collaboration. I have reviewed all notes and am in agreement with care and plan.   Eland Lamantia S Kamariah Fruchter, MD 01/17/12 1948 

## 2012-01-17 NOTE — Progress Notes (Signed)
IOC reviewed looks good no new labs today please let us know if we could be of any further assistance

## 2012-01-17 NOTE — Discharge Summary (Signed)
General Surgery [facility]  As below.  Velora Heckler, MD, [facility] Office: [phone]

## 2012-01-17 NOTE — Discharge Summary (Signed)
Physician Discharge Summary  Patient ID: Yesenia George MRN: [id number] DOB/AGE: [birth year] 31 y.o.  Admit date: 01/13/2012 Discharge date: 01/17/2012  Admission Diagnoses: Right upper quadrant abdominal pain  Discharge Diagnoses:  Principal Problem:  *Cholelithiasis Active Problems:  Sickle cell trait  Right upper quadrant abdominal pain  Elevated LFTs  CKD (chronic kidney disease), stage II  Pancreatitis  Morbid obesity with BMI of 40.0-44.9, adult   Discharged Condition: stable  Hospital Course:is a 31 y.o. African American female no significant past medical history except sickle cell trait, obesity, preeclampsia, post partum with delivery in August of 2013 currently lactating she presented with the above complaints. Patient reports that she started having abdominal pain on 01/11/2012 this was accompanied by nausea and vomiting. She had imaging done which showed cholelithiasis, she had seen  Dr. Maisie Fus with general surgery and was in the process of having an elective laparoscopic cholecystectomy scheduled. The patient was experiencing severe right upper quadrant abdominal pain with nausea and vomiting and presented to the emergency department on 01/14/12 for further evaluation.  In the emergency department patient was found to have mildly elevated lipase with elevated liver function tests. She was admitted to the hospitalist service secondary to her multiple medical issues and for further care and management. Patient denied any recent fevers, chills, or chest pain. She does report having shortness of breath with deep inspiration as it can make her abdominal pain worse. denied any diarrhea and in fact reports not having a bowel movement since 01/11/2012. Denied any headaches or vision changes. She underwent laparoscopic cholecystectomy on 01/16/12 by Dr. Gerrit Friends, post op she has remained hemodynamically stable and afebrile. At this time she is stable for discharge to home self care and will  follow up with Dr. Gerrit Friends in 2 weeks time. She has been given postoperative care instructions and our office number in case she needs to contact our office prior to her planned follow-up visit. All information has been reviewed with the patient and she has verbalized understanding of the instructions, and follow up care.   Consults: None  Significant Diagnostic Studies: labs, [place].  Treatments: IV hydration, antibiotics, analgesia, respiratory therapy,and surgery.  Discharge Exam: Blood pressure 132/75, pulse 73, temperature 98.6 F (37 C), temperature source Oral, resp. rate 20, height 5\' 6"  (1.676 m), weight 269 lb (122.018 kg), SpO2 95.00%. General appearance: alert, cooperative, appears stated age and no distress Chest: CTA bilaterally Cardiac: RRR Abdomen: Soft, non distended, slightly tender over surgical wound sites, + BS. No n/v all surgical wound sites appear to be well approximated and there is only a small amount of old blood on the umbilicus dressing. This has been replaced along with the steri-strips. No erythema seen on exam.  Disposition: 01-Home or Self Care  Discharge Orders    Future Orders Please Complete By Expires   Discharge patient      Comments:   To home or self care.  Follow up with Dr. Gerrit Friends in 2 weeks time as per scheduled.       Medication List     As of 01/17/2012  8:29 AM    TAKE these medications         acetaminophen 325 MG tablet   Commonly known as: TYLENOL   Take 2 tablets (650 mg total) by mouth every 4 (four) hours as needed for pain.      ondansetron 4 MG tablet   Commonly known as: ZOFRAN   Take 1 tablet (4 mg total) by mouth  every 6 (six) hours as needed for nausea.      prenatal multivitamin Tabs   Take 1 tablet by mouth every morning.           Follow-up Information    Follow up with Velora Heckler, MD. In 2 weeks. (Call our office as needed, or  if symptoms worsen)    Contact information:   [address] [place] [address] [phone]          Signed: Blenda Mounts [facility] Pager # [phone]  01/17/2012, 8:29 AM

## 2012-01-19 ENCOUNTER — Telehealth (INDEPENDENT_AMBULATORY_CARE_PROVIDER_SITE_OTHER): Payer: Self-pay

## 2012-01-19 NOTE — Telephone Encounter (Signed)
Pt home doing well. PO appt made. 

## 2012-02-08 ENCOUNTER — Encounter: Payer: Medicaid Other | Admitting: Obstetrics and Gynecology

## 2012-02-15 ENCOUNTER — Ambulatory Visit (INDEPENDENT_AMBULATORY_CARE_PROVIDER_SITE_OTHER): Payer: Self-pay | Admitting: Surgery

## 2012-02-15 ENCOUNTER — Encounter (INDEPENDENT_AMBULATORY_CARE_PROVIDER_SITE_OTHER): Payer: Self-pay | Admitting: Surgery

## 2012-02-15 VITALS — BP 122/90 | HR 68 | Temp 97.2°F | Resp 16 | Ht 66.0 in | Wt 273.4 lb

## 2012-02-15 DIAGNOSIS — K8020 Calculus of gallbladder without cholecystitis without obstruction: Secondary | ICD-10-CM

## 2012-02-15 DIAGNOSIS — K859 Acute pancreatitis without necrosis or infection, unspecified: Secondary | ICD-10-CM

## 2012-02-15 NOTE — Progress Notes (Signed)
General Surgery [facility]  Visit Diagnoses: 1. Pancreatitis   2. Cholelithiasis     HISTORY: Patient is a 31 year old black female who underwent laparoscopic cholecystectomy. Final pathology shows chronic cholecystitis and cholelithiasis. Her postoperative course has been uneventful.  EXAM: Abdomen is soft nontender without distention. Surgical wounds are well-healed. No sign of herniation. No sign of seroma. No sign of infection. Right upper quadrant is soft and nontender without mass.  IMPRESSION: Status post laparoscopic cholecystectomy for chronic cholecystitis and cholelithiasis  PLAN: Patient will begin applying topical creams to her incisions. She is released to full activity without restriction. She will return for followup as needed.  Velora Heckler, MD, FACS General & Endocrine Surgery [facility]

## 2012-02-15 NOTE — Patient Instructions (Signed)
  COCOA BUTTER & VITAMIN E CREAM  ([employer] or other brand)  Apply cocoa butter/vitamin E cream to your incision 2 - 3 times daily.  Massage cream into incision for one minute with each application.  Use sunscreen (50 SPF or higher) for first 6 months after surgery if area is exposed to sun.  You may substitute Mederma or other scar reducing creams as desired.   

## 2012-03-22 ENCOUNTER — Other Ambulatory Visit: Payer: Self-pay | Admitting: Obstetrics and Gynecology

## 2012-03-22 NOTE — Telephone Encounter (Signed)
Pt calling in order to have bc pills sent to her pharmacy. Pt has not previously been prescribed bc pills by any providers in this office. Advised that pt needs to schedule an appointment for evaluation, scheduled with EP for 04/09/12. Pt agreeable.

## 2012-04-09 ENCOUNTER — Encounter: Payer: Self-pay | Admitting: Obstetrics and Gynecology

## 2012-04-09 ENCOUNTER — Ambulatory Visit: Payer: Self-pay | Admitting: Obstetrics and Gynecology

## 2012-04-09 VITALS — BP 114/70 | HR 72 | Wt 281.0 lb

## 2012-04-09 DIAGNOSIS — IMO0001 Reserved for inherently not codable concepts without codable children: Secondary | ICD-10-CM

## 2012-04-09 DIAGNOSIS — Z309 Encounter for contraceptive management, unspecified: Secondary | ICD-10-CM

## 2012-04-09 MED ORDER — NORETHINDRONE 0.35 MG PO TABS: 1.0000 | ORAL_TABLET | Freq: Every day | ORAL | Status: DC

## 2012-04-09 NOTE — Progress Notes (Signed)
32 YO Lactating patient for contraception.  Delivered vaginally 11/06/2011. Has had the Mirena x 3 weeks prior to pregnancy but felt the strings and bled more than she wanted to so had it removed.  Reviewed contraceptive options-patient wants Micronor.  A: Contraceptive Management     Breastfeeding  P: Handout given on Contraceptive options       Micronor  #1 1 po qd 11 refills       RTO-as scheduled or prn  Anala Whisenant, PA-C

## 2012-04-28 ENCOUNTER — Other Ambulatory Visit: Payer: Self-pay

## 2013-01-17 ENCOUNTER — Other Ambulatory Visit: Payer: Self-pay

## 2014-01-13 ENCOUNTER — Encounter: Payer: Self-pay | Admitting: Obstetrics and Gynecology

## 2015-03-15 NOTE — L&D Delivery Note (Addendum)
Delivery Note At 8:44 AM a viable female, "Yesenia George", was delivered via Vaginal, Spontaneous Delivery (Presentation: LOA).  APGAR: 8, 9; weight  .   Placenta status: Intact, Spontaneous.  Cord: 3 vessels with the following complications: None.  Cord pH: NA  Anesthesia: Epidural  Episiotomy: None Lacerations: 1st degree;Vaginal--hemostatic, no repair required. Suture Repair: NA Est. Blood Loss (mL): 150  Mom to postpartum.  Baby to Couplet care / Skin to Skin. Family plans circumcision, but unsure if inpatient or outpatient--patient planning to call insurances today to determine. Patient undecided regarding contraception.  Yesenia George, Yesenia George 08/21/2015, 9:17 AM

## 2015-05-22 ENCOUNTER — Encounter (HOSPITAL_COMMUNITY): Payer: Self-pay

## 2015-05-22 ENCOUNTER — Emergency Department (HOSPITAL_COMMUNITY)
Admission: EM | Admit: 2015-05-22 | Discharge: 2015-05-23 | Disposition: A | Discharge status: Home / Self Care | Payer: Medicaid Other | Attending: Emergency Medicine | Admitting: Emergency Medicine

## 2015-05-22 ENCOUNTER — Emergency Department (HOSPITAL_COMMUNITY): Payer: Medicaid Other

## 2015-05-22 DIAGNOSIS — Z8742 Personal history of other diseases of the female genital tract: Secondary | ICD-10-CM | POA: Insufficient documentation

## 2015-05-22 DIAGNOSIS — Z8719 Personal history of other diseases of the digestive system: Secondary | ICD-10-CM | POA: Insufficient documentation

## 2015-05-22 DIAGNOSIS — Z3A26 26 weeks gestation of pregnancy: Secondary | ICD-10-CM | POA: Diagnosis not present

## 2015-05-22 DIAGNOSIS — J988 Other specified respiratory disorders: Secondary | ICD-10-CM

## 2015-05-22 DIAGNOSIS — Z91040 Latex allergy status: Secondary | ICD-10-CM | POA: Diagnosis not present

## 2015-05-22 DIAGNOSIS — Z8619 Personal history of other infectious and parasitic diseases: Secondary | ICD-10-CM | POA: Insufficient documentation

## 2015-05-22 DIAGNOSIS — O99512 Diseases of the respiratory system complicating pregnancy, second trimester: Secondary | ICD-10-CM | POA: Insufficient documentation

## 2015-05-22 DIAGNOSIS — J069 Acute upper respiratory infection, unspecified: Secondary | ICD-10-CM | POA: Diagnosis not present

## 2015-05-22 DIAGNOSIS — Z79899 Other long term (current) drug therapy: Secondary | ICD-10-CM | POA: Insufficient documentation

## 2015-05-22 DIAGNOSIS — J189 Pneumonia, unspecified organism: Secondary | ICD-10-CM

## 2015-05-22 DIAGNOSIS — J159 Unspecified bacterial pneumonia: Secondary | ICD-10-CM | POA: Insufficient documentation

## 2015-05-22 DIAGNOSIS — Z862 Personal history of diseases of the blood and blood-forming organs and certain disorders involving the immune mechanism: Secondary | ICD-10-CM | POA: Diagnosis not present

## 2015-05-22 DIAGNOSIS — O9989 Other specified diseases and conditions complicating pregnancy, childbirth and the puerperium: Secondary | ICD-10-CM | POA: Diagnosis present

## 2015-05-22 DIAGNOSIS — B9789 Other viral agents as the cause of diseases classified elsewhere: Secondary | ICD-10-CM

## 2015-05-22 LAB — CBC WITH DIFFERENTIAL/PLATELET
BASOS ABS: 0 10*3/uL (ref 0.0–0.1)
Basophils Relative: 0 %
EOS ABS: 0.1 10*3/uL (ref 0.0–0.7)
EOS PCT: 2 %
HCT: 31.1 % — ABNORMAL LOW (ref 36.0–46.0)
Hemoglobin: 10.8 g/dL — ABNORMAL LOW (ref 12.0–15.0)
LYMPHS PCT: 18 %
Lymphs Abs: 1.3 10*3/uL (ref 0.7–4.0)
MCH: 29.1 pg (ref 26.0–34.0)
MCHC: 34.7 g/dL (ref 30.0–36.0)
MCV: 83.8 fL (ref 78.0–100.0)
Monocytes Absolute: 0.5 10*3/uL (ref 0.1–1.0)
Monocytes Relative: 7 %
Neutro Abs: 5.5 10*3/uL (ref 1.7–7.7)
Neutrophils Relative %: 73 %
PLATELETS: 189 10*3/uL (ref 150–400)
RBC: 3.71 MIL/uL — AB (ref 3.87–5.11)
RDW: 14.1 % (ref 11.5–15.5)
WBC: 7.5 10*3/uL (ref 4.0–10.5)

## 2015-05-22 LAB — BASIC METABOLIC PANEL
ANION GAP: 10 (ref 5–15)
BUN: 5 mg/dL — ABNORMAL LOW (ref 6–20)
CO2: 20 mmol/L — ABNORMAL LOW (ref 22–32)
Calcium: 8.9 mg/dL (ref 8.9–10.3)
Chloride: 107 mmol/L (ref 101–111)
Creatinine, Ser: 0.78 mg/dL (ref 0.44–1.00)
GFR calc Af Amer: >60 mL/min (ref >60)
Glucose, Bld: 100 mg/dL — ABNORMAL HIGH (ref 65–99)
POTASSIUM: 3.5 mmol/L (ref 3.5–5.1)
SODIUM: 137 mmol/L (ref 135–145)

## 2015-05-22 LAB — I-STAT CG4 LACTIC ACID, ED: LACTIC ACID, VENOUS: 0.58 mmol/L (ref 0.5–2.0)

## 2015-05-22 LAB — RAPID STREP SCREEN (MED CTR MEBANE ONLY): STREPTOCOCCUS, GROUP A SCREEN (DIRECT): NEGATIVE

## 2015-05-22 IMAGING — CR DG CHEST 2V
2 series · 2 of 2 positions shown · non-contrast
Comparison: [DATE]

CLINICAL DATA: Shortness of breath, cough, sore throat and fever.
Patient is 26 weeks pregnant.

EXAM:
CHEST  2 VIEW

[w chest pa]
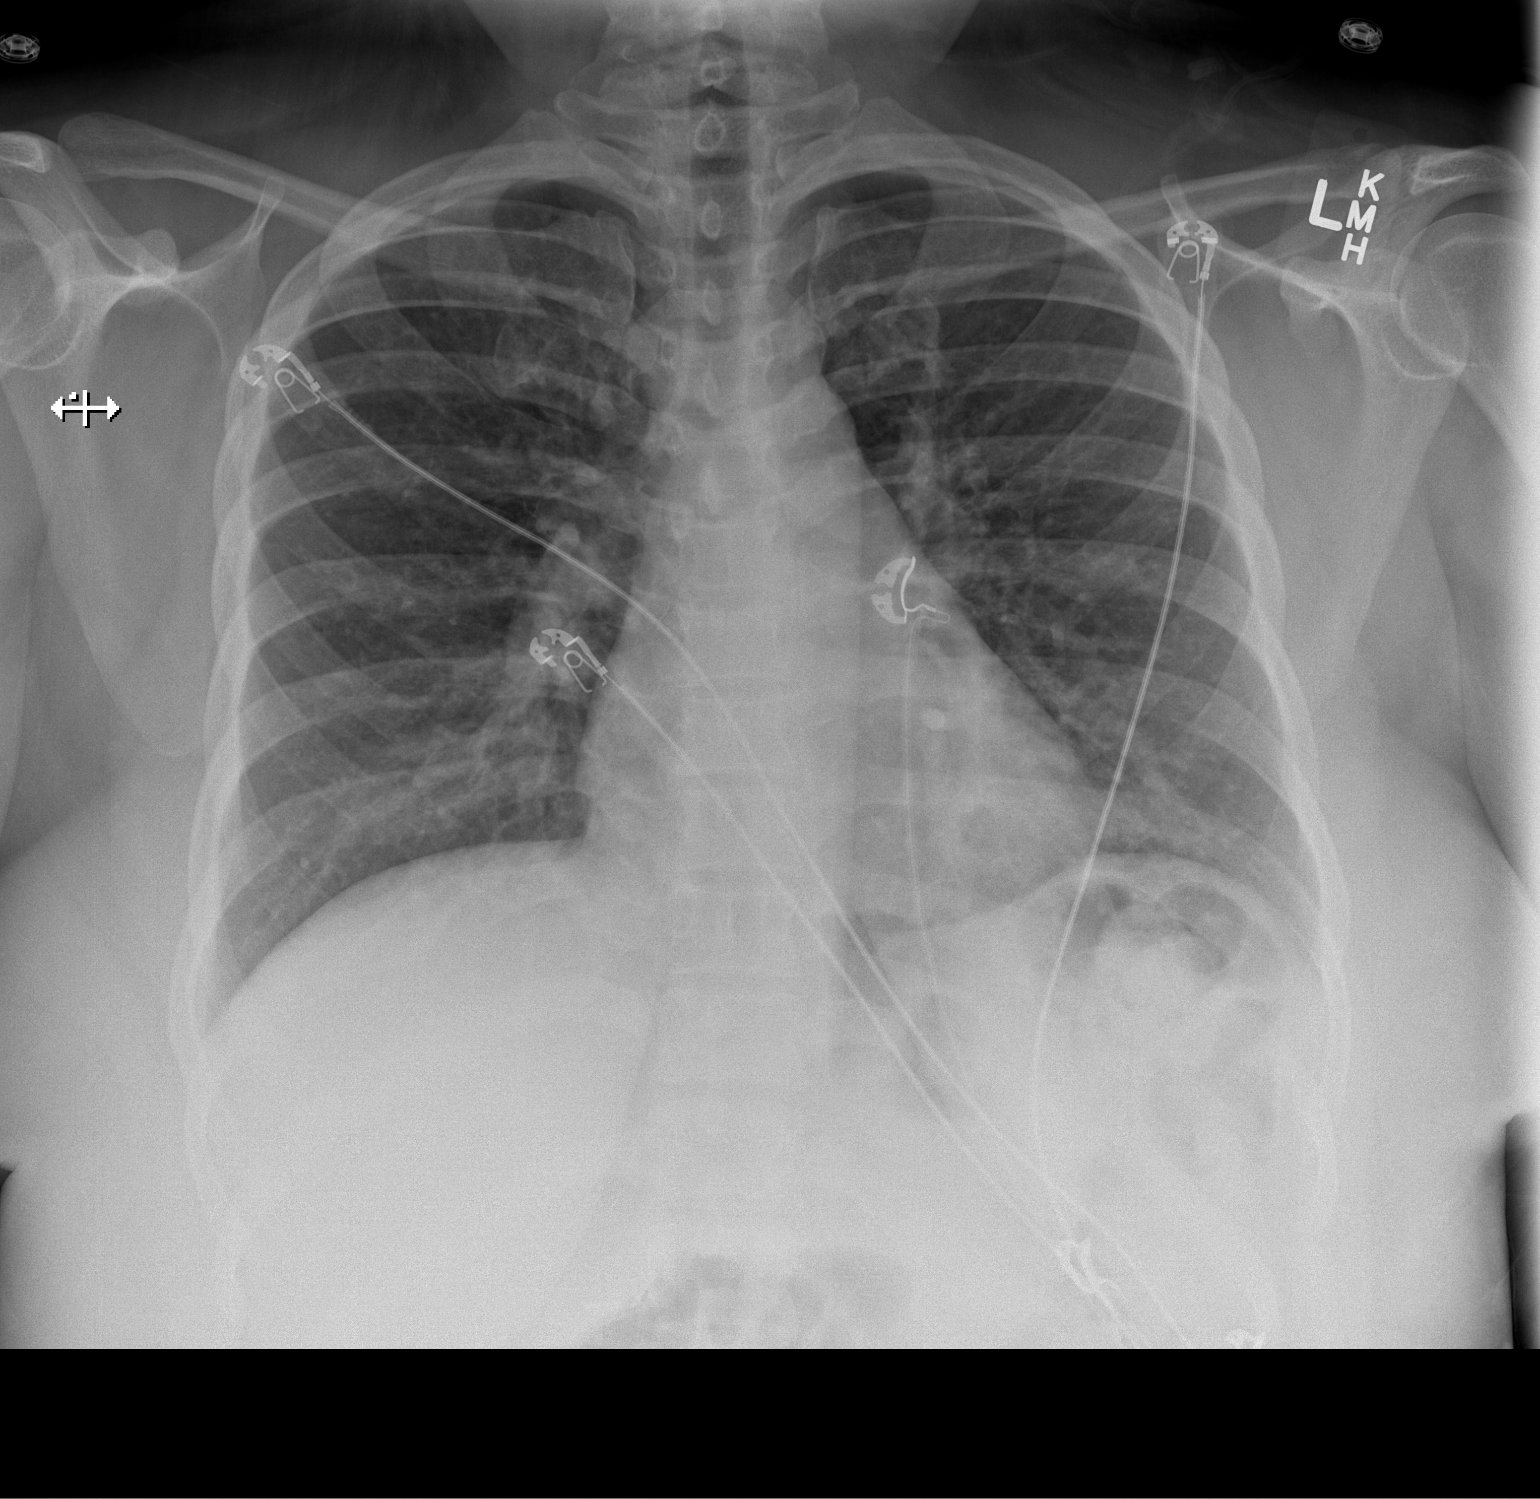

[w chest lat]
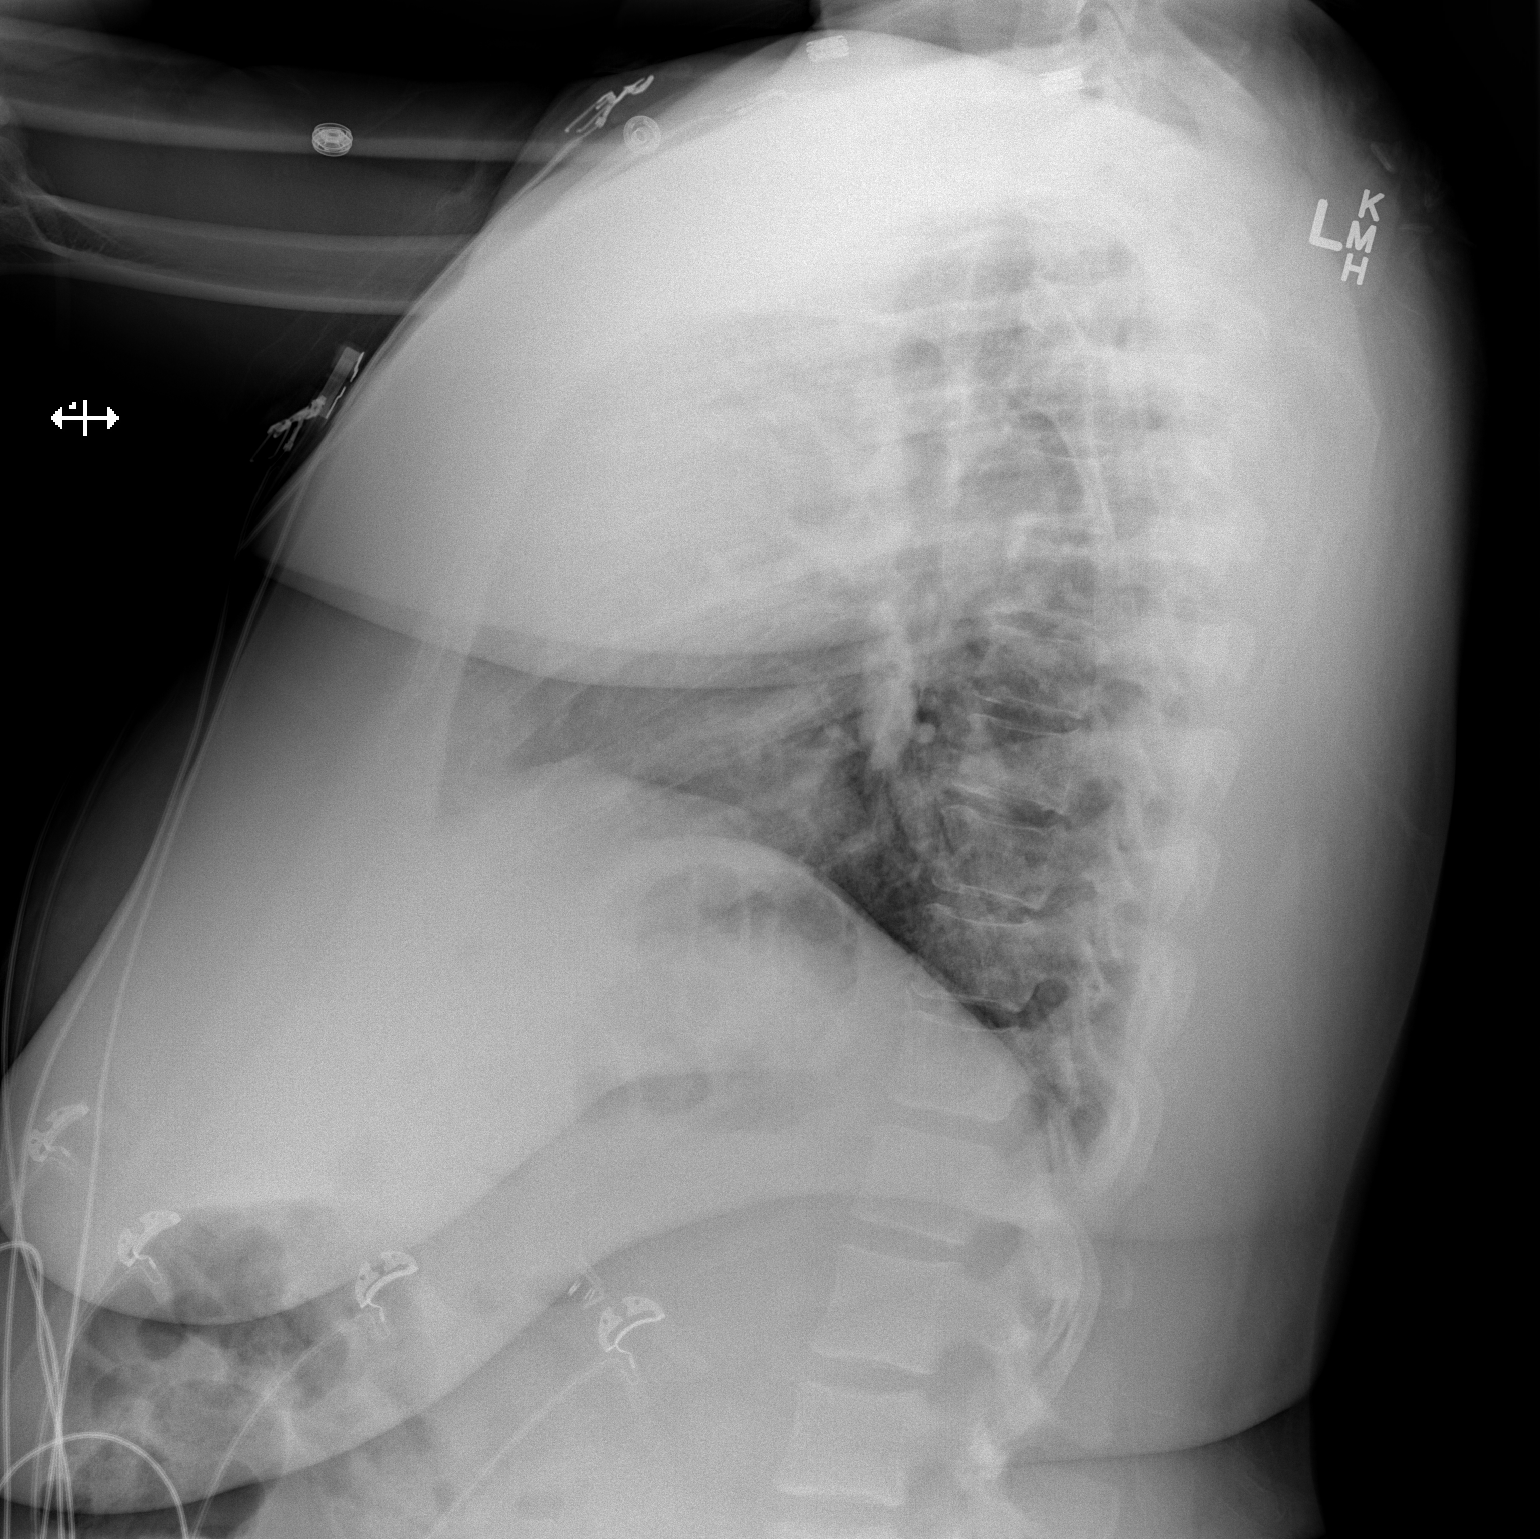

[2 of 2 positions shown; findings below may reference images not displayed]

FINDINGS: The cardiomediastinal contours are normal. Ill-defined left basilar
opacity. Pulmonary vasculature is normal. No consolidation, pleural
effusion, or pneumothorax. No acute osseous abnormalities are seen.
IMPRESSION: Ill-defined left basilar opacity, favoring atelectasis, early
pneumonia could have a similar appearance.

## 2015-05-22 MED ORDER — SODIUM CHLORIDE 0.9 % IV BOLUS (SEPSIS)
1000.0000 mL | Freq: Once | INTRAVENOUS | Status: AC
  Administered 2015-05-22: 1000 mL via INTRAVENOUS
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
## 2015-05-22 NOTE — ED Notes (Signed)
MD at bedside. 

## 2015-05-22 NOTE — ED Notes (Addendum)
Pt reports she was instructed by her PCP to go to ED due to her c/o SOB, Cough, Sore Throat, and Fever. Pt is currently [redacted] weeks pregnant. Pt reports she has a child at home who is positive for Flu. Pt A+OX4, speaking in complete sentences, ambulatory to triage.

## 2015-05-23 MED ORDER — AMOXICILLIN 500 MG PO CAPS: 500.0000 mg | ORAL_CAPSULE | Freq: Three times a day (TID) | ORAL | Status: DC

## 2015-05-23 NOTE — Discharge Instructions (Signed)
Your chest x-ray shows possible pneumonia, for which we have given you a course of antibiotics. Follow-up very closely with your primary care provider for re-evaluation. Return without fail for worsening symptoms, including worsening difficulty breathing or chest pain, persistent fevers despite treatment, or any other symptoms concerning to you.  Community-Acquired Pneumonia, Adult Pneumonia is an infection of the lungs. There are different types of pneumonia. One type can develop while a person is in a hospital. A different type, called community-acquired pneumonia, develops in people who are not, or have not recently been, in the hospital or other health care facility.  CAUSES Pneumonia may be caused by bacteria, viruses, or funguses. Community-acquired pneumonia is often caused by Streptococcus pneumonia bacteria. These bacteria are often passed from one person to another by breathing in droplets from the cough or sneeze of an infected person. RISK FACTORS The condition is more likely to develop in:  People who havechronic diseases, such as chronic obstructive pulmonary disease (COPD), asthma, congestive heart failure, cystic fibrosis, diabetes, or kidney disease.  People who haveearly-stage or late-stage HIV.  People who havesickle cell disease.  People who havehad their spleen removed (splenectomy).  People who havepoor Administratordental hygiene.  People who havemedical conditions that increase the risk of breathing in (aspirating) secretions their own mouth and nose.   People who havea weakened immune system (immunocompromised).  People who smoke.  People whotravel to areas where pneumonia-causing germs commonly exist.  People whoare around animal habitats or animals that have pneumonia-causing germs, including birds, bats, rabbits, cats, and farm animals. SYMPTOMS Symptoms of this condition include:  Adry cough.  A wet (productive) cough.  Fever.  Sweating.  Chest pain,  especially when breathing deeply or coughing.  Rapid breathing or difficulty breathing.  Shortness of breath.  Shaking chills.  Fatigue.  Muscle aches. DIAGNOSIS Your health care provider will take a medical history and perform a physical exam. You may also have other tests, including:  Imaging studies of your chest, including X-rays.  Tests to check your blood oxygen level and other blood gases.  Other tests on blood, mucus (sputum), fluid around your lungs (pleural fluid), and urine. If your pneumonia is severe, other tests may be done to identify the specific cause of your illness. TREATMENT The type of treatment that you receive depends on many factors, such as the cause of your pneumonia, the medicines you take, and other medical conditions that you have. For most adults, treatment and recovery from pneumonia may occur at home. In some cases, treatment must happen in a hospital. Treatment may include:  Antibiotic medicines, if the pneumonia was caused by bacteria.  Antiviral medicines, if the pneumonia was caused by a virus.  Medicines that are given by mouth or through an IV tube.  Oxygen.  Respiratory therapy. Although rare, treating severe pneumonia may include:  Mechanical ventilation. This is done if you are not breathing well on your own and you cannot maintain a safe blood oxygen level.  Thoracentesis. This procedureremoves fluid around one lung or both lungs to help you breathe better. HOME CARE INSTRUCTIONS  Take over-the-counter and prescription medicines only as told by your health care provider.  Only takecough medicine if you are losing sleep. Understand that cough medicine can prevent your body's natural ability to remove mucus from your lungs.  If you were prescribed an antibiotic medicine, take it as told by your health care provider. Do not stop taking the antibiotic even if you start to feel better.  Sleep in a semi-upright position at night. Try  sleeping in a reclining chair, or place a few pillows under your head.  Do not use tobacco products, including cigarettes, chewing tobacco, and e-cigarettes. If you need help quitting, ask your health care provider.  Drink enough water to keep your urine clear or pale yellow. This will help to thin out mucus secretions in your lungs. PREVENTION There are ways that you can decrease your risk of developing community-acquired pneumonia. Consider getting a pneumococcal vaccine if:  You are older than 33 years of age.  You are older than 33 years of age and are undergoing cancer treatment, have chronic lung disease, or have other medical conditions that affect your immune system. Ask your health care provider if this applies to you. There are different types and schedules of pneumococcal vaccines. Ask your health care provider which vaccination option is best for you. You may also prevent community-acquired pneumonia if you take these actions:  Get an influenza vaccine every year. Ask your health care provider which type of influenza vaccine is best for you.  Go to the dentist on a regular basis.  Wash your hands often. Use hand sanitizer if soap and water are not available. SEEK MEDICAL CARE IF:  You have a fever.  You are losing sleep because you cannot control your cough with cough medicine. SEEK IMMEDIATE MEDICAL CARE IF:  You have worsening shortness of breath.  You have increased chest pain.  Your sickness becomes worse, especially if you are an older adult or have a weakened immune system.  You cough up blood.   This information is not intended to replace advice given to you by your health care provider. Make sure you discuss any questions you have with your health care provider.   Document Released: 02/28/2005 Document Revised: 11/19/2014 Document Reviewed: 06/25/2014 Elsevier Interactive Patient Education [employer]

## 2015-05-25 LAB — CULTURE, GROUP A STREP (THRC)

## 2015-08-20 ENCOUNTER — Inpatient Hospital Stay (HOSPITAL_COMMUNITY)
Admission: AD | Admit: 2015-08-20 | Discharge: 2015-08-23 | DRG: 775 | Disposition: A | Discharge status: Home / Self Care | Payer: Medicaid Other | Source: Ambulatory Visit | Attending: Obstetrics and Gynecology | Admitting: Obstetrics and Gynecology

## 2015-08-20 ENCOUNTER — Encounter (HOSPITAL_COMMUNITY): Payer: Self-pay

## 2015-08-20 DIAGNOSIS — Z6841 Body Mass Index (BMI) 40.0 and over, adult: Secondary | ICD-10-CM

## 2015-08-20 DIAGNOSIS — O4200 Premature rupture of membranes, onset of labor within 24 hours of rupture, unspecified weeks of gestation: Secondary | ICD-10-CM | POA: Diagnosis present

## 2015-08-20 DIAGNOSIS — O99824 Streptococcus B carrier state complicating childbirth: Secondary | ICD-10-CM | POA: Diagnosis present

## 2015-08-20 DIAGNOSIS — Z91040 Latex allergy status: Secondary | ICD-10-CM | POA: Diagnosis not present

## 2015-08-20 DIAGNOSIS — Z8249 Family history of ischemic heart disease and other diseases of the circulatory system: Secondary | ICD-10-CM

## 2015-08-20 DIAGNOSIS — D573 Sickle-cell trait: Secondary | ICD-10-CM | POA: Diagnosis present

## 2015-08-20 DIAGNOSIS — O99214 Obesity complicating childbirth: Secondary | ICD-10-CM | POA: Diagnosis present

## 2015-08-20 DIAGNOSIS — IMO0002 Reserved for concepts with insufficient information to code with codable children: Secondary | ICD-10-CM

## 2015-08-20 DIAGNOSIS — Z825 Family history of asthma and other chronic lower respiratory diseases: Secondary | ICD-10-CM | POA: Diagnosis not present

## 2015-08-20 DIAGNOSIS — Z3A39 39 weeks gestation of pregnancy: Secondary | ICD-10-CM | POA: Diagnosis not present

## 2015-08-20 DIAGNOSIS — O4210 Premature rupture of membranes, onset of labor more than 24 hours following rupture, unspecified weeks of gestation: Secondary | ICD-10-CM | POA: Diagnosis present

## 2015-08-20 DIAGNOSIS — O4202 Full-term premature rupture of membranes, onset of labor within 24 hours of rupture: Principal | ICD-10-CM | POA: Diagnosis present

## 2015-08-20 DIAGNOSIS — Z889 Allergy status to unspecified drugs, medicaments and biological substances status: Secondary | ICD-10-CM

## 2015-08-20 LAB — CBC
HEMATOCRIT: 31.7 % — AB (ref 36.0–46.0)
Hemoglobin: 11.2 g/dL — ABNORMAL LOW (ref 12.0–15.0)
MCH: 28.4 pg (ref 26.0–34.0)
MCHC: 35.3 g/dL (ref 30.0–36.0)
MCV: 80.5 fL (ref 78.0–100.0)
Platelets: 172 10*3/uL (ref 150–400)
RBC: 3.94 MIL/uL (ref 3.87–5.11)
RDW: 14.7 % (ref 11.5–15.5)
WBC: 8.1 10*3/uL (ref 4.0–10.5)

## 2015-08-20 LAB — AMNISURE RUPTURE OF MEMBRANE (ROM) NOT AT ARMC: Amnisure ROM: POSITIVE

## 2015-08-20 MED ORDER — FLEET ENEMA 7-19 GM/118ML RE ENEM: 1.0000 | ENEMA | RECTAL | Status: DC | PRN

## 2015-08-20 MED ORDER — OXYTOCIN BOLUS FROM INFUSION
500.0000 mL | INTRAVENOUS | Status: DC
  Administered 2015-08-21: 500 mL via INTRAVENOUS

## 2015-08-20 MED ORDER — SOD CITRATE-CITRIC ACID 500-334 MG/5ML PO SOLN: 30.0000 mL | ORAL | Status: DC | PRN

## 2015-08-20 MED ORDER — OXYTOCIN 40 UNITS IN LACTATED RINGERS INFUSION - SIMPLE MED: 1.0000 m[IU]/min | INTRAVENOUS | Status: DC

## 2015-08-20 MED ORDER — DEXTROSE 5 % IV SOLN
5.0000 10*6.[IU] | Freq: Once | INTRAVENOUS | Status: AC
  Administered 2015-08-20: 5 10*6.[IU] via INTRAVENOUS
  Filled 2015-08-20: qty 5

## 2015-08-20 MED ORDER — PENICILLIN G POTASSIUM 5000000 UNITS IJ SOLR
2.5000 10*6.[IU] | INTRAVENOUS | Status: DC
  Administered 2015-08-21: 2.5 10*6.[IU] via INTRAVENOUS
  Filled 2015-08-20 (×3): qty 2.5

## 2015-08-20 MED ORDER — LIDOCAINE HCL (PF) 1 % IJ SOLN
30.0000 mL | INTRAMUSCULAR | Status: DC | PRN
  Filled 2015-08-20: qty 30

## 2015-08-20 MED ORDER — LACTATED RINGERS IV SOLN
INTRAVENOUS | Status: DC
  Administered 2015-08-20: 23:00:00 via INTRAVENOUS

## 2015-08-20 MED ORDER — LACTATED RINGERS IV SOLN: 500.0000 mL | INTRAVENOUS | Status: DC | PRN

## 2015-08-20 MED ORDER — ACETAMINOPHEN 325 MG PO TABS: 650.0000 mg | ORAL_TABLET | ORAL | Status: DC | PRN

## 2015-08-20 MED ORDER — ONDANSETRON HCL 4 MG/2ML IJ SOLN
4.0000 mg | Freq: Four times a day (QID) | INTRAMUSCULAR | Status: DC | PRN
  Administered 2015-08-20: 4 mg via INTRAVENOUS
  Filled 2015-08-20: qty 2

## 2015-08-20 MED ORDER — OXYTOCIN 40 UNITS IN LACTATED RINGERS INFUSION - SIMPLE MED
2.5000 [IU]/h | INTRAVENOUS | Status: DC
  Filled 2015-08-20: qty 1000

## 2015-08-20 MED ORDER — TERBUTALINE SULFATE 1 MG/ML IJ SOLN
0.2500 mg | Freq: Once | INTRAMUSCULAR | Status: DC | PRN
  Filled 2015-08-20: qty 1

## 2015-08-20 MED ORDER — NALBUPHINE HCL 10 MG/ML IJ SOLN
10.0000 mg | INTRAMUSCULAR | Status: DC | PRN
  Administered 2015-08-21: 10 mg via INTRAVENOUS
  Filled 2015-08-20: qty 1

## 2015-08-20 MED ORDER — OXYCODONE-ACETAMINOPHEN 5-325 MG PO TABS: 1.0000 | ORAL_TABLET | ORAL | Status: DC | PRN

## 2015-08-20 MED ORDER — OXYCODONE-ACETAMINOPHEN 5-325 MG PO TABS: 2.0000 | ORAL_TABLET | ORAL | Status: DC | PRN

## 2015-08-20 MED ORDER — MISOPROSTOL 200 MCG PO TABS
50.0000 ug | ORAL_TABLET | ORAL | Status: DC
  Administered 2015-08-21 (×2): 50 ug via ORAL
  Filled 2015-08-20 (×2): qty 0.5

## 2015-08-20 NOTE — MAU Note (Signed)
Patient presents with PROM. Patient states she has been having trickling since this morning and has had to wear a panty liner. Patient also states she saw some blood when she wiped. Fetus active.
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
## 2015-08-20 NOTE — MAU Note (Signed)
Notified provider that patient is here.   

## 2015-08-21 ENCOUNTER — Inpatient Hospital Stay (HOSPITAL_COMMUNITY): Payer: Medicaid Other | Admitting: Anesthesiology

## 2015-08-21 ENCOUNTER — Encounter (HOSPITAL_COMMUNITY): Payer: Self-pay | Admitting: Anesthesiology

## 2015-08-21 DIAGNOSIS — IMO0002 Reserved for concepts with insufficient information to code with codable children: Secondary | ICD-10-CM

## 2015-08-21 LAB — TYPE AND SCREEN
ABO/RH(D): A POS
Antibody Screen: NEGATIVE

## 2015-08-21 LAB — RPR: RPR: NONREACTIVE

## 2015-08-21 MED ORDER — DIBUCAINE 1 % RE OINT: 1.0000 "application " | TOPICAL_OINTMENT | RECTAL | Status: DC | PRN

## 2015-08-21 MED ORDER — OXYCODONE HCL 5 MG PO TABS: 10.0000 mg | ORAL_TABLET | ORAL | Status: DC | PRN

## 2015-08-21 MED ORDER — LACTATED RINGERS IV SOLN: 500.0000 mL | Freq: Once | INTRAVENOUS | Status: DC

## 2015-08-21 MED ORDER — LIDOCAINE HCL (PF) 1 % IJ SOLN
INTRAMUSCULAR | Status: DC | PRN
  Administered 2015-08-21: 8 mL via EPIDURAL
  Administered 2015-08-21: 5 mL via EPIDURAL

## 2015-08-21 MED ORDER — ONDANSETRON HCL 4 MG/2ML IJ SOLN: 4.0000 mg | INTRAMUSCULAR | Status: DC | PRN

## 2015-08-21 MED ORDER — BENZOCAINE-MENTHOL 20-0.5 % EX AERO: 1.0000 "application " | INHALATION_SPRAY | CUTANEOUS | Status: DC | PRN

## 2015-08-21 MED ORDER — WITCH HAZEL-GLYCERIN EX PADS: 1.0000 "application " | MEDICATED_PAD | CUTANEOUS | Status: DC | PRN

## 2015-08-21 MED ORDER — FENTANYL 2.5 MCG/ML BUPIVACAINE 1/10 % EPIDURAL INFUSION (WH - ANES)
14.0000 mL/h | INTRAMUSCULAR | Status: DC | PRN
  Administered 2015-08-21 (×2): 14 mL/h via EPIDURAL
  Filled 2015-08-21: qty 125

## 2015-08-21 MED ORDER — OXYCODONE HCL 5 MG PO TABS: 5.0000 mg | ORAL_TABLET | ORAL | Status: DC | PRN

## 2015-08-21 MED ORDER — SENNOSIDES-DOCUSATE SODIUM 8.6-50 MG PO TABS
2.0000 | ORAL_TABLET | ORAL | Status: DC
  Administered 2015-08-21 – 2015-08-22 (×2): 2 via ORAL
  Filled 2015-08-21 (×2): qty 2

## 2015-08-21 MED ORDER — EPHEDRINE 5 MG/ML INJ
10.0000 mg | INTRAVENOUS | Status: DC | PRN
Start: 2015-08-21 — End: 2015-08-21
  Filled 2015-08-21: qty 2

## 2015-08-21 MED ORDER — EPHEDRINE 5 MG/ML INJ
10.0000 mg | INTRAVENOUS | Status: DC | PRN
  Filled 2015-08-21: qty 2

## 2015-08-21 MED ORDER — PRENATAL MULTIVITAMIN CH
1.0000 | ORAL_TABLET | Freq: Every day | ORAL | Status: DC
  Administered 2015-08-22 – 2015-08-23 (×2): 1 via ORAL
  Filled 2015-08-21 (×2): qty 1

## 2015-08-21 MED ORDER — DIPHENHYDRAMINE HCL 50 MG/ML IJ SOLN: 12.5000 mg | INTRAMUSCULAR | Status: DC | PRN

## 2015-08-21 MED ORDER — PHENYLEPHRINE 40 MCG/ML (10ML) SYRINGE FOR IV PUSH (FOR BLOOD PRESSURE SUPPORT)
80.0000 ug | PREFILLED_SYRINGE | INTRAVENOUS | Status: DC | PRN
  Filled 2015-08-21: qty 10
  Filled 2015-08-21: qty 5

## 2015-08-21 MED ORDER — DIPHENHYDRAMINE HCL 25 MG PO CAPS: 25.0000 mg | ORAL_CAPSULE | Freq: Four times a day (QID) | ORAL | Status: DC | PRN

## 2015-08-21 MED ORDER — COCONUT OIL OIL: 1.0000 "application " | TOPICAL_OIL | Status: DC | PRN

## 2015-08-21 MED ORDER — ZOLPIDEM TARTRATE 5 MG PO TABS: 5.0000 mg | ORAL_TABLET | Freq: Every evening | ORAL | Status: DC | PRN

## 2015-08-21 MED ORDER — PHENYLEPHRINE 40 MCG/ML (10ML) SYRINGE FOR IV PUSH (FOR BLOOD PRESSURE SUPPORT)
80.0000 ug | PREFILLED_SYRINGE | INTRAVENOUS | Status: DC | PRN
  Filled 2015-08-21: qty 5

## 2015-08-21 MED ORDER — ACETAMINOPHEN 325 MG PO TABS: 650.0000 mg | ORAL_TABLET | ORAL | Status: DC | PRN

## 2015-08-21 MED ORDER — ONDANSETRON HCL 4 MG PO TABS: 4.0000 mg | ORAL_TABLET | ORAL | Status: DC | PRN

## 2015-08-21 MED ORDER — TETANUS-DIPHTH-ACELL PERTUSSIS 5-2.5-18.5 LF-MCG/0.5 IM SUSP: 0.5000 mL | Freq: Once | INTRAMUSCULAR | Status: DC

## 2015-08-21 MED ORDER — SIMETHICONE 80 MG PO CHEW: 80.0000 mg | CHEWABLE_TABLET | ORAL | Status: DC | PRN

## 2015-08-21 MED ORDER — IBUPROFEN 600 MG PO TABS
600.0000 mg | ORAL_TABLET | Freq: Four times a day (QID) | ORAL | Status: DC
  Administered 2015-08-21 – 2015-08-23 (×8): 600 mg via ORAL
  Filled 2015-08-21 (×8): qty 1

## 2015-08-21 NOTE — Anesthesia Postprocedure Evaluation (Signed)
Anesthesia Post Note  Patient: Yesenia George  Procedure(s) Performed: * No procedures listed *  Patient location during evaluation: Mother Baby Anesthesia Type: Epidural Level of consciousness: awake and alert Pain management: pain level controlled Vital Signs Assessment: post-procedure vital signs reviewed and stable Respiratory status: spontaneous breathing, nonlabored ventilation and respiratory function stable Cardiovascular status: stable Postop Assessment: no headache, no backache and epidural receding Anesthetic complications: no     Last Vitals:  Filed Vitals:   08/21/15 1015 08/21/15 1100  BP:  116/63  Pulse:  61  Temp: 36.7 C   Resp:      Last Pain:  Filed Vitals:   08/21/15 1102  PainSc: 6    Pain Goal:                 Junious SilkGILBERT,Jazelle Achey

## 2015-08-21 NOTE — Anesthesia Procedure Notes (Signed)
Epidural Patient location during procedure: OB Start time: 08/21/2015 7:40 AM End time: 08/21/2015 7:44 AM  Staffing Anesthesiologist: Leilani AbleHATCHETT, Cleave Ternes Performed by: anesthesiologist   Preanesthetic Checklist Completed: patient identified, surgical consent, pre-op evaluation, timeout performed, IV checked, risks and benefits discussed and monitors and equipment checked  Epidural Patient position: sitting Prep: site prepped and draped and DuraPrep Patient monitoring: continuous pulse ox and blood pressure Approach: midline Location: L3-L4 Injection technique: LOR air  Needle:  Needle type: Tuohy  Needle gauge: 17 G Needle length: 9 cm and 9 Needle insertion depth: 8 cm Catheter type: closed end flexible Catheter size: 19 Gauge Catheter at skin depth: 13 cm Test dose: negative and Other  Assessment Sensory level: T9 Events: blood not aspirated, injection not painful, no injection resistance, negative IV test and no paresthesia  Additional Notes Reason for block:procedure for pain
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
## 2015-08-21 NOTE — Lactation Note (Signed)
This note was copied from a baby's chart. Lactation Consultation Note  P2, Ex BF 11 months.  Reviewed hand expression w/ mother.  Mother has large breasts and uses 2 hand to express. Mother latched baby in football hold and compressed breast for depth. Encouraged her to compress/massage breast to keep baby active. When baby slipped down on nipple mother relatched for depth. Discussed basics and reviewed pp 20-24 in Baby & Me booklet. Denies questions or problems. Mom encouraged to feed baby 8-12 times/24 hours and with feeding cues.  Mom made aware of O/P services, breastfeeding support groups, community resources, and our phone # for post-discharge questions.     Patient Name: Yesenia Ezzard Standingboghoye Stierwalt [id number]'YToday's Date: 08/21/2015 Reason for consult: Initial assessment   Maternal Data Has patient been taught Hand Expression?: Yes Does the patient have breastfeeding experience prior to this delivery?: Yes  Feeding Feeding Type: Breast Fed Length of feed: 10 min  LATCH Score/Interventions Latch: Grasps breast easily, tongue down, lips flanged, rhythmical sucking. Intervention(s): Adjust position;Assist with latch;Breast massage;Breast compression  Audible Swallowing: A few with stimulation Intervention(s): Skin to skin;Hand expression Intervention(s): Skin to skin;Hand expression  Type of Nipple: Everted at rest and after stimulation Intervention(s): No intervention needed  Comfort (Breast/Nipple): Soft / non-tender     Hold (Positioning): No assistance needed to correctly position infant at breast. Intervention(s): Breastfeeding basics reviewed;Support Pillows;Position options;Skin to skin  LATCH Score: 9  Lactation Tools Discussed/Used     Consult Status Consult Status: Follow-up Date: 08/22/15 Follow-up type: In-patient    Dahlia ByesBerkelhammer, Ruth [facility] 08/21/2015, 2:02 PM

## 2015-08-21 NOTE — Anesthesia Preprocedure Evaluation (Signed)
Anesthesia Evaluation  Patient identified by MRN, date of birth, ID band Patient awake    Reviewed: Allergy & Precautions, H&P , NPO status , Patient's Chart, lab work & pertinent test results  Airway Mallampati: II  TM Distance: >3 FB Neck ROM: full    Dental no notable dental hx.    Pulmonary neg pulmonary ROS,    Pulmonary exam normal        Cardiovascular negative cardio ROS Normal cardiovascular exam     Neuro/Psych negative neurological ROS  negative psych ROS   GI/Hepatic negative GI ROS, Neg liver ROS,   Endo/Other  Morbid obesity  Renal/GU negative Renal ROS     Musculoskeletal   Abdominal (+) + obese,   Peds  Hematology   Anesthesia Other Findings   Reproductive/Obstetrics (+) Pregnancy                             Anesthesia Physical Anesthesia Plan  ASA: III  Anesthesia Plan: Epidural   Post-op Pain Management:    Induction:   Airway Management Planned:   Additional Equipment:   Intra-op Plan:   Post-operative Plan:   Informed Consent: I have reviewed the patients History and Physical, chart, labs and discussed the procedure including the risks, benefits and alternatives for the proposed anesthesia with the patient or authorized representative who has indicated his/her understanding and acceptance.     Plan Discussed with:   Anesthesia Plan Comments:         Anesthesia Quick Evaluation

## 2015-08-22 LAB — CBC
HCT: 29.5 % — ABNORMAL LOW (ref 36.0–46.0)
Hemoglobin: 10.2 g/dL — ABNORMAL LOW (ref 12.0–15.0)
MCH: 28 pg (ref 26.0–34.0)
MCHC: 34.6 g/dL (ref 30.0–36.0)
MCV: 81 fL (ref 78.0–100.0)
PLATELETS: 140 10*3/uL — AB (ref 150–400)
RBC: 3.64 MIL/uL — ABNORMAL LOW (ref 3.87–5.11)
RDW: 14.9 % (ref 11.5–15.5)
WBC: 9.3 10*3/uL (ref 4.0–10.5)

## 2015-08-22 NOTE — Progress Notes (Signed)
Subjective: Postpartum Day 1: Vaginal delivery, 1st degree vaginal laceration Patient up ad lib, reports no syncope or dizziness. Feeding:  Breast Contraceptive plan:  IUD  Family plans outpatient circumcision.  Objective: Vital signs in last 24 hours: Temp:  [97.8 F (36.6 C)-99.1 F (37.3 C)] 99.1 F (37.3 C) (06/10 1007) Pulse Rate:  [61-67] 65 (06/10 1007) Resp:  [16-20] 16 (06/10 1007) BP: (107-131)/(61-67) 115/61 mmHg (06/10 1007) SpO2:  [100 %] 100 % (06/09 2050)  Physical Exam:  General: alert Lochia: appropriate Uterine Fundus: firm Perineum: healing well DVT Evaluation: No evidence of DVT seen on physical exam. Negative Homan's sign.   CBC Latest Ref Rng 08/22/2015 08/20/2015 05/22/2015  WBC 4.0 - 10.5 K/uL 9.3 8.1 7.5  Hemoglobin 12.0 - 15.0 g/dL 10.2(L) 11.2(L) 10.8(L)  Hematocrit 36.0 - 46.0 % 29.5(L) 31.7(L) 31.1(L)  Platelets 150 - 400 K/uL 140(L) 172 189     Assessment/Plan: Status post vaginal delivery day 1. Stable Continue current care. Plan for discharge tomorrow      Nyra CapesLATHAM, VICKICNM 08/22/2015, 10:23 AM
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
## 2015-08-22 NOTE — Lactation Note (Signed)
This note was copied from a baby's chart. Lactation Consultation Note Follow up visit at 37 hours of age.  Mom has baby latched now and know to work on flanging lips and call for assist if latch is painful.  Mom denies concerns at this time.  Mom to call for assist as needed.    Patient Name: Yesenia George Reason for consult: Follow-up assessment   Maternal Data    Feeding Feeding Type: Breast Fed Length of feed: 15 min  LATCH Score/Interventions Latch: Grasps breast easily, tongue down, lips flanged, rhythmical sucking. Intervention(s): Adjust position;Assist with latch;Breast massage;Breast compression  Audible Swallowing: A few with stimulation  Type of Nipple: Everted at rest and after stimulation  Comfort (Breast/Nipple): Soft / non-tender     Hold (Positioning): No assistance needed to correctly position infant at breast. Intervention(s): Breastfeeding basics reviewed;Support Pillows;Position options;Skin to skin  LATCH Score: 9  Lactation Tools Discussed/Used     Consult Status Consult Status: Follow-up Date: 08/23/15 Follow-up type: In-patient    Jannifer RodneyShoptaw, [name], 10:41 PM
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
## 2015-08-22 NOTE — Discharge Instructions (Signed)

## 2015-08-23 MED ORDER — IBUPROFEN 600 MG PO TABS: 600.0000 mg | ORAL_TABLET | Freq: Four times a day (QID) | ORAL | Status: AC | PRN

## 2015-08-23 NOTE — Lactation Note (Signed)
This note was copied from a baby's chart. Lactation Consultation Note  Patient Name: Yesenia Ezzard Standingboghoye Levi ZOXWR'UToday's Date: 08/23/2015 Reason for consult: Follow-up assessment   With this mom of a term baby, now 5450 hours old. The baby was having pictures taken at this time, baby screaming, I offered to watch her latch the baby, she said no, and denied any questions/concerns. Mom states breastfeeding going well  and she knows to call for questions/concerns.    Maternal Data    Feeding    LATCH Score/Interventions                      Lactation Tools Discussed/Used     Consult Status Consult Status: Complete Follow-up type: Call as needed    Alfred LevinsLee, Cathye Kreiter Anne 08/23/2015, 11:32 AM
